# Patient Record
Sex: Female | Born: 1985 | Race: White | Hispanic: No | Marital: Married | Smoking: Current every day smoker
Health system: Southern US, Community
[De-identification: ages and names within clinical notes are randomized; demographics above are authoritative.]

## PROBLEM LIST (undated history)

## (undated) DIAGNOSIS — G43909 Migraine, unspecified, not intractable, without status migrainosus: Secondary | ICD-10-CM

---

## 2014-04-22 ENCOUNTER — Emergency Department (HOSPITAL_BASED_OUTPATIENT_CLINIC_OR_DEPARTMENT_OTHER): Payer: Self-pay

## 2014-04-22 ENCOUNTER — Emergency Department (HOSPITAL_BASED_OUTPATIENT_CLINIC_OR_DEPARTMENT_OTHER)
Admission: EM | Admit: 2014-04-22 | Discharge: 2014-04-22 | Disposition: A | Discharge status: Home / Self Care | Payer: Self-pay | Attending: Emergency Medicine | Admitting: Emergency Medicine

## 2014-04-22 ENCOUNTER — Encounter (HOSPITAL_BASED_OUTPATIENT_CLINIC_OR_DEPARTMENT_OTHER): Payer: Self-pay | Admitting: Emergency Medicine

## 2014-04-22 DIAGNOSIS — Y9289 Other specified places as the place of occurrence of the external cause: Secondary | ICD-10-CM | POA: Insufficient documentation

## 2014-04-22 DIAGNOSIS — W108XXA Fall (on) (from) other stairs and steps, initial encounter: Secondary | ICD-10-CM | POA: Insufficient documentation

## 2014-04-22 DIAGNOSIS — S93401A Sprain of unspecified ligament of right ankle, initial encounter: Secondary | ICD-10-CM | POA: Insufficient documentation

## 2014-04-22 DIAGNOSIS — Y9389 Activity, other specified: Secondary | ICD-10-CM | POA: Insufficient documentation

## 2014-04-22 DIAGNOSIS — Y998 Other external cause status: Secondary | ICD-10-CM | POA: Insufficient documentation

## 2014-04-22 DIAGNOSIS — R52 Pain, unspecified: Secondary | ICD-10-CM

## 2014-04-22 MED ORDER — TRAMADOL HCL 50 MG PO TABS: 50.0000 mg | ORAL_TABLET | Freq: Four times a day (QID) | ORAL | Status: DC | PRN

## 2014-04-22 NOTE — ED Provider Notes (Signed)
CSN: [id number]     Arrival date & time 04/22/14  1240 History   First MD Initiated Contact with Patient 04/22/14 1328     Chief Complaint  Patient presents with  . Foot Pain     (Consider location/radiation/quality/duration/timing/severity/associated sxs/prior Treatment) HPI   Stephanie Le is a 29 y.o. female complaining of pain to right lateral foot after slip and fall last night. Patient was standing on a step stool, she fell, she does not think there was head trauma, there is definitely not any LOC, cervicalgia, chest pain, shortness of breath. She rates her pain at 10 out of 10 when ambulating, is minimal when she is not weightbearing. No pain medication taken prior to arrival. No history of prior surgeries or traumas to the affected area.  History reviewed. No pertinent past medical history. History reviewed. No pertinent past surgical history. No family history on file. History  Substance Use Topics  . Smoking status: Not on file  . Smokeless tobacco: Not on file  . Alcohol Use: Not on file   OB History    No data available     Review of Systems  10 systems reviewed and found to be negative, except as noted in the HPI.  Allergies  Review of patient's allergies indicates no known allergies.  Home Medications   Prior to Admission medications   Medication Sig Start Date End Date Taking? Authorizing Provider  traMADol (ULTRAM) 50 MG tablet Take 1 tablet (50 mg total) by mouth every 6 (six) hours as needed. 04/22/14   Jerlene Rockers, PA-C   BP 119/59 mmHg  Pulse 76  Temp(Src) 98.3 F (36.8 C) (Oral)  Resp 18  Ht  (1.575 m)  Wt 190 lb (86.183 kg)  BMI 34.74 kg/m2  SpO2 98%  LMP 03/13/2014 Physical Exam  Constitutional: She is oriented to person, place, and time. She appears well-developed and well-nourished. No distress.  HENT:  Head: Normocephalic.  Eyes: Conjunctivae and EOM are normal.  Cardiovascular: Normal rate.   Pulmonary/Chest: Effort normal.  No stridor.  Musculoskeletal: Normal range of motion.       Feet:  Patient has swelling and pain as depicted on the diagram, there is no malleoli or tenderness to palpation, she has excellent range of motion in the toes and ankle. There is no tenderness palpation over the fifth digit.  Neurological: She is alert and oriented to person, place, and time.  Psychiatric: She has a normal mood and affect.  Nursing note and vitals reviewed.   ED Course  Procedures (including critical care time) Labs Review Labs Reviewed - No data to display  Imaging Review Dg Foot Complete Right  04/22/2014   CLINICAL DATA:  Pain following fall 1 day previously. Pain and swelling predominantly lateral.  EXAM: RIGHT FOOT COMPLETE - 3+ VIEW  COMPARISON:  None.  FINDINGS: Frontal, oblique, and lateral views were obtained. There is an apparent nondisplaced fracture along the junction of ankylosed fifth middle and distal phalanges. The ankylosis is an anatomic variant. The lucency in this area is oblique in orientation suggesting fracture as opposed to a rudimentary joint space. No other demonstrable fracture. No dislocation. Joint spaces appear intact.  IMPRESSION: Obliquely oriented fracture at the junction of the ankylosed middle and distal phalanges of the fifth digit. No other fracture. No dislocation. Joint spaces appear intact.   Electronically Signed   By: Bretta Bang M.D.   On: 04/22/2014 13:42     EKG Interpretation None  MDM   Final diagnoses:  Right ankle sprain, initial encounter    Filed Vitals:   04/22/14 1301 04/22/14 1504  BP: 147/76 119/59  Pulse: 79 76  Temp: 98.3 F (36.8 C)   TempSrc: Oral   Resp: 18 18  Height: 5\' 2"  (1.575 m)   Weight: 190 lb (86.183 kg)   SpO2: 100% 98%    Stephanie Le is a pleasant 29 y.o. female presenting with right lateral foot swelling and pain after patient had a slip and fall last night. X-ray shows a fracture of the right fifth distal  phalanx. The patient has no tenderness to palpation, swelling or signs of trauma in that area. She reports that she has broken toes in the past, this is likely a remote fracture. I've asked the patient if she would like it buddy taped and she declines. Patient will be given a Ace wrap, recommend rest, ice, compression elevation. Patient declines any pain medication in the ED. She has crutches which she's been using since last night. They are appropriately sized. She will be given sports medicine follow-up as needed if symptoms persist past 3-4 weeks.  Evaluation does not show pathology that would require ongoing emergent intervention or inpatient treatment. Pt is hemodynamically stable and mentating appropriately. Discussed findings and plan with patient/guardian, who agrees with care plan. All questions answered. Return precautions discussed and outpatient follow up given.   New Prescriptions   TRAMADOL (ULTRAM) 50 MG TABLET    Take 1 tablet (50 mg total) by mouth every 6 (six) hours as needed.        Wynetta Emeryicole Jalaysia Lobb, PA-C 04/22/14 1513  Rolan BuccoMelanie Belfi, MD 04/22/14 1555

## 2014-04-22 NOTE — ED Notes (Signed)
PT presents to ED with complaints of right foot pain after falling off stool last night landing on her right foot.

## 2014-04-22 NOTE — Discharge Instructions (Signed)
Rest, Ice intermittently (in the first 24-48 hours), Gentle compression with an Ace wrap, and elevate (Limb above the level of the heart)   Take up to 800mg  of ibuprofen (that is usually 4 over the counter pills)  3 times a day for 5 days. Take with food.   For breakthrough pain you may take Tramadol. Do not drink alcohol drive or operate heavy machinery when taking Tramadol.  Do not hesitate to return to the emergency room for any new, worsening or concerning symptoms.  Please obtain primary care using resource guide below. But the minute you were seen in the emergency room and that they will need to obtain records for further outpatient management.    Emergency Department Resource Guide 1) Find a Doctor and Pay Out of Pocket Although you won't have to find out who is covered by your insurance plan, it is a good idea to ask around and get recommendations. You will then need to call the office and see if the doctor you have chosen will accept you as a new patient and what types of options they offer for patients who are self-pay. Some doctors offer discounts or will set up payment plans for their patients who do not have insurance, but you will need to ask so you aren't surprised when you get to your appointment.  2) Contact Your Local Health Department Not all health departments have doctors that can see patients for sick visits, but many do, so it is worth a call to see if yours does. If you don't know where your local health department is, you can check in your phone book. The CDC also has a tool to help you locate your state's health department, and many state websites also have listings of all of their local health departments.  3) Find a Walk-in Clinic If your illness is not likely to be very severe or complicated, you may want to try a walk in clinic. These are popping up all over the country in pharmacies, drugstores, and shopping centers. They're usually staffed by nurse practitioners or  physician assistants that have been trained to treat common illnesses and complaints. They're usually fairly quick and inexpensive. However, if you have serious medical issues or chronic medical problems, these are probably not your best option.  No Primary Care Doctor: - Call Health Connect at  [phone] - they can help you locate a primary care doctor that  accepts your insurance, provides certain services, etc. - Physician Referral Service- [phone]  Chronic Pain Problems: Organization         Address  Phone   Notes  [facility]  [phone]) [phone] Patients need to be referred by their primary care doctor.   Medication Assistance: Organization         Address  Phone   Notes  [facility] Medication [facility]istance Program [address] [place], [place] [address] [phone]) [phone] --Must be a resident of [facility] -- Must have NO insurance coverage whatsoever (no Medicaid/ Medicare, etc.) -- The pt. MUST have a primary care doctor that directs their care regularly and follows them in the community   MedAssist  [phone]   [employer]  [phone]    Agencies that provide inexpensive medical care: Organization         Address  Phone   Notes  [facility]  [phone]) [phone]   [facility]    [phone]   Women's  [facility] [place], [place] [phone]   [facility] [place] [address] [place] [phone]   Planned Parenthood    [phone]   [facility]    [phone]   [address], [place] Phone:  [phone], Fax:  [phone] Hours of Operation:  9 am - 6 pm, M-F.  Also accepts Medicaid/Medicare and self-pay.  [facility] for [place], [address], [place] Phone: [phone], Fax: [phone]. Hours of Operation:  8:30 am - 5:30 pm, M-F.   Also accepts Medicaid and self-pay.  [facility] [place] [address] [place] Phone: [phone]   [place], [place], [place] [phone], Ext. [phone] Mondays & Thursdays: 7-9 AM.  First 15 patients are seen on a first come, first serve basis.    [place] Providers:  Organization         Address  Phone   Notes  [facility] [address], [place] [phone] Also accepts self-pay patients.  [facility] [address], [place]  [phone]   [place], [address], [place] [phone]   [facility] [address] [place] [phone]   Lucianne Lei [address], [place]   [phone] Only accepts [place] Access [place] patients after they have their name applied to their card.   Self-Pay (no insurance) in [facility]:  Organization         Address  Phone   Notes  Sickle Cell Patients, [facility] [place] [phone]   [facility] [place] [phone]   [facility] [place]  [place], [place], [place] [phone]   [facility]/Dr. Osei-Bonsu  [address] [place] or [address], [place] [phone] Phone number for both [place] and [place] locations is the same.  Urgent Medical and [facility] [address], [place] [phone]   [facility] [address] [place] or [address] [phone]   [facility] [address], [place] [phone], phone; [phone], fax Sees patients 1st and 3rd Saturday of every month.  Must not qualify for public or private insurance (i.e. Medicaid, Medicare, [place] Health Choice, Veterans'  Benefits)  Household income should be no more than 200% of the poverty level The clinic cannot treat you if you are pregnant or think you are pregnant  Sexually transmitted diseases are not treated at the clinic.    Dental Care: Organization         Address  Phone  Notes  [facility] of [facility] [place] [phone] Accepts children up to age 68 who are enrolled in [place] or [place]; pregnant women with a Medicaid card; and children who have applied for Medicaid or [place] Health Choice, but were declined, whose parents can pay a reduced fee at time of service.  [facility] Department of [facility]  [address], [place] [phone] Accepts children up to age 32 who are  enrolled in Medicaid or [place] Health Choice; pregnant women with a Medicaid card; and children who have applied for Medicaid or [place] Health Choice, but were declined, whose parents can pay a reduced fee at time of service.  [place] Adult Dental Access PROGRAM  [place] [phone] Patients are seen by appointment only. Walk-ins are not accepted. [place] will see patients 76 years of age and older. Monday - Tuesday (8am-5pm) Most Wednesdays (8:30-5pm) $30 per visit, cash only  [facility] Adult Dental Access PROGRAM  [address], [facility] [phone] Patients are seen by appointment only. Walk-ins are not accepted. [place] will see patients 70 years of age and older. One Wednesday Evening (Monthly: Volunteer Based).  $30 per visit, cash only  [place]  [phone] for adults; Children under age 52, call Graduate Pediatric Dentistry at [phone]. Children aged 69-14, please call [phone] to request a pediatric application.  Dental services are provided in all areas of dental care including fillings, crowns and bridges, complete and partial  dentures, implants, gum treatment, root canals, and extractions. Preventive care is also provided. Treatment is provided to both adults and children. Patients are selected via a lottery and there is often a waiting list.   [facility] [address] [place]  [phone] [email or web address]   [facility] [address] [place], [place] [phone], Ext. [phone] Second and Fourth Thursday of each month, opens at 6:30 AM; Clinic ends at 9 AM.  Patients are seen on a first-come first-served basis, and a limited number are seen during each clinic.   [facility]  [address] Hillard Danker [place], [place] [phone]   Eligibility Requirements You must have lived in [place], [place], or [place] counties for at least the last three months.   You cannot be eligible for state or federal sponsored [employer], including [employer], [place], or [employer].   You generally cannot be eligible for healthcare insurance through your employer.    How to apply: Eligibility screenings are held every Tuesday and Wednesday afternoon from 1:00 pm until 4:00 pm. You do not need an appointment for the interview!  [facility] [address], [place], [place]   [place]  [place] Department  [place]  [phone]    Behavioral Health Resources in the Community: Intensive Outpatient Programs Organization         Address  Phone  Notes  [place]. [address], [place], [place] [phone]   [facility] [address] [place], [place]   ADS: Alcohol & Drug Svcs [address] [place], [place]   [place] [address],  [place], [place] or [phone]   Substance Abuse Resources Organization          Address  Phone  Notes  Alcohol and Drug Services  [phone]   [place]  [phone]   [facility] [place]   Chinita [place]  [phone]   Residential & Outpatient Substance Abuse Program  [phone]   Psychological Services Organization         Address  Phone  Notes  [facility] [place]  [place]  [phone]   [place] [address] [place]  [phone] or [phone]    Mobile Crisis Teams Organization         Address  Phone  Notes  [facility], Mobile Crisis Care Unit  [phone]   [facility]  [address] [place], [place] [phone]   [facility] De[facility]sch [address] [place] [phone]    Self-Help/Support Groups Organization         Address  Phone             Notes  [facility] [place] - variety of support groups  336- [phone] Call for more information  Narcotics Anonymous (NA), [facility] [address], [place]  2 meetings at this location   Statisticianesidential Treatment Programs Organization         Address  Phone  Notes  [facility] [address],    [place]  [phone]   [facility]  [address], [place] [phone], [place], [place] [phone]   [facility] [address], [place] [phone] Admissions: 8am-3pm M-F  [facility] [address]    [place], [place] [phone]   [facility] [address], [place], [place] [phone]   [facility] [address]  [place], [place] [phone]   [facility] - Intensive Outpatient [address], [place], [place] [phone]   [facility] ([facility] [address]  [place], [place] [phone] or [phone]   [facility] (RTS) [address] [place], [place]  [phone] Accepts Medicaid  [facility] [address]  [place] [phone] Substance Abuse/Addiction Treatment   [facility] Behavioral Health Resources Organization         Address  Phone  Notes  [facility]  [phone]   Angie FavaJulie Brannon, PhD [address], [address] [place], [place]   [phone] or [phone]) [phone]   [facility]   [address] [place], [place] [phone](336) [phone]   [facility] [address], [place], [place] [phone](336) [phone] Insurance/Medicaid/sponsorship through [facility]  [facility] Families [address]                                    [place], [place] [phone]) [phone] Therapy/tele-psych/case  [facility] [address].   [place], [place] [phone]) [phone]    Dr. Lolly MustacheArfeen  [phone](336) [phone]   [facility]  [employer] [facility] 1) [address], [place] 2) [address], [place] 3)  [address], [place] [phone](336) [phone]  [phone]) [phone]   [facility] Child Abuse Hotline [phone](336) [phone] or [phone](336) [phone] (After Hours)

## 2014-06-05 ENCOUNTER — Emergency Department (HOSPITAL_BASED_OUTPATIENT_CLINIC_OR_DEPARTMENT_OTHER)
Admission: EM | Admit: 2014-06-05 | Discharge: 2014-06-05 | Disposition: A | Discharge status: Home / Self Care | Payer: Self-pay | Attending: Emergency Medicine | Admitting: Emergency Medicine

## 2014-06-05 ENCOUNTER — Encounter (HOSPITAL_BASED_OUTPATIENT_CLINIC_OR_DEPARTMENT_OTHER): Payer: Self-pay | Admitting: Emergency Medicine

## 2014-06-05 DIAGNOSIS — L299 Pruritus, unspecified: Secondary | ICD-10-CM | POA: Insufficient documentation

## 2014-06-05 HISTORY — DX: Migraine, unspecified, not intractable, without status migrainosus: G43.909

## 2014-06-05 MED ORDER — LORAZEPAM 1 MG PO TABS
2.0000 mg | ORAL_TABLET | Freq: Once | ORAL | Status: AC
  Administered 2014-06-05: 2 mg via ORAL
  Filled 2014-06-05: qty 2

## 2014-06-05 MED ORDER — LORAZEPAM 1 MG PO TABS: 1.0000 mg | ORAL_TABLET | Freq: Four times a day (QID) | ORAL | Status: DC | PRN

## 2014-06-05 MED ORDER — PREDNISONE 20 MG PO TABS: 60.0000 mg | ORAL_TABLET | Freq: Every day | ORAL | Status: AC

## 2014-06-05 MED ORDER — METHYLPREDNISOLONE SODIUM SUCC 125 MG IJ SOLR
125.0000 mg | Freq: Once | INTRAMUSCULAR | Status: AC
  Administered 2014-06-05: 125 mg via INTRAMUSCULAR
  Filled 2014-06-05: qty 2

## 2014-06-05 NOTE — Discharge Instructions (Signed)
Pruritus  Pruritus is an itch. There are many different problems that can cause an itch. Dry skin is one of the most common causes of itching. Most cases of itching do not require medical attention.  HOME CARE INSTRUCTIONS  Make sure your skin is moistened on a regular basis. A moisturizer that contains petroleum jelly is best for keeping moisture in your skin. If you develop a rash, you may try the following for relief:   Use corticosteroid cream.  Apply cool compresses to the affected areas.  Bathe with Epsom salts or baking soda in the bathwater.  Soak in colloidal oatmeal baths (like Aveeno). These are available at your pharmacy.  Apply baking soda paste to the rash. Stir water into baking soda until it reaches a paste-like consistency.  Use an anti-itch lotion.  Take over-the-counter diphenhydramine medicine by mouth as the instructions direct.  You may take Benadryl 50 mg every 6 hours as needed.  Avoid scratching. Scratching may cause the rash to become infected. If itching is very bad, your caregiver may suggest prescription lotions or creams to lessen your symptoms.  Avoid hot showers, which can make itching worse. A cold shower may help with itching as long as you use a moisturizer after the shower. SEEK MEDICAL CARE IF: The itching does not go away after several days. Document Released: 12/11/2010 Document Revised: 08/15/2013 Document Reviewed: 12/11/2010 [facility] Patient Information 2015 [place], [place]. This information is not intended to replace advice given to you by your health care provider. Make sure you discuss any questions you have with your health care provider.    Possible Early Allergic Reaction/Anaphylactic Reaction An anaphylactic reaction is a sudden, severe allergic reaction that involves the whole body. It can be life threatening. A hospital stay is often required. People with asthma, eczema, or hay fever are slightly more likely to have an anaphylactic  reaction. CAUSES  An anaphylactic reaction may be caused by anything to which you are allergic. After being exposed to the allergic substance, your immune system becomes sensitized to it. When you are exposed to that allergic substance again, an allergic reaction can occur. Common causes of an anaphylactic reaction include:  Medicines.  Foods, especially peanuts, wheat, shellfish, milk, and eggs.  Insect bites or stings.  Blood products.  Chemicals, such as dyes, latex, and contrast material used for imaging tests. SYMPTOMS  When an allergic reaction occurs, the body releases histamine and other substances. These substances cause symptoms such as tightening of the airway. Symptoms often develop within seconds or minutes of exposure. Symptoms may include:  Skin rash or hives.  Itching.  Chest tightness.  Swelling of the eyes, tongue, or lips.  Trouble breathing or swallowing.  Lightheadedness or fainting.  Anxiety or confusion.  Stomach pains, vomiting, or diarrhea.  Nasal congestion.  A fast or irregular heartbeat (palpitations). DIAGNOSIS  Diagnosis is based on your history of recent exposure to allergic substances, your symptoms, and a physical exam. Your caregiver may also perform blood or urine tests to confirm the diagnosis. TREATMENT  Epinephrine medicine is the main treatment for an anaphylactic reaction. Other medicines that may be used for treatment include antihistamines, steroids, and albuterol. In severe cases, fluids and medicine to support blood pressure may be given through an intravenous line (IV). Even if you improve after treatment, you need to be observed to make sure your condition does not get worse. This may require a stay in the hospital. [place] a medical  alert bracelet or necklace stating your allergy.  You and your family must learn how to use an anaphylaxis kit or give an epinephrine injection to temporarily treat an  emergency allergic reaction. Always carry your epinephrine injection or anaphylaxis kit with you. This can be lifesaving if you have a severe reaction.  Do not drive or perform tasks after treatment until the medicines used to treat your reaction have worn off, or until your caregiver says it is okay.  If you have hives or a rash:  Take medicines as directed by your caregiver.  You may use an over-the-counter antihistamine (diphenhydramine) as needed.  Apply cold compresses to the skin or take baths in cool water. Avoid hot baths or showers. SEEK MEDICAL CARE IF:   You develop symptoms of an allergic reaction to a new substance. Symptoms may start right away or minutes later.  You develop a rash, hives, or itching.  You develop new symptoms. SEEK IMMEDIATE MEDICAL CARE IF:   You have swelling of the mouth, difficulty breathing, or wheezing.  You have a tight feeling in your chest or throat.  You develop hives, swelling, or itching all over your body.  You develop severe vomiting or diarrhea.  You feel faint or pass out. This is an emergency. Use your epinephrine injection or anaphylaxis kit as you have been instructed. Call your local emergency services (911 in [place] Even if you improve after the injection, you need to be examined at a hospital emergency department. MAKE SURE YOU:   Understand these instructions.  Will watch your condition.  Will get help right away if you are not doing well or get worse. Document Released: 03/31/2005 Document Revised: 04/05/2013 Document Reviewed: 07/02/2011 [facility] Patient Information 2015 [place], [place]. This information is not intended to replace advice given to you by your health care provider. Make sure you discuss any questions you have with your health care provider.

## 2014-06-05 NOTE — ED Notes (Signed)
Pt reports onset of itching and discomfort approx. 7 pm yesterday no hives or SOB noted but continues to have itching. Denies throat itching or difficulty swallowing

## 2014-06-05 NOTE — ED Provider Notes (Signed)
TIME SEEN: 6:00 AM  CHIEF COMPLAINT: Possible allergic reaction  HPI: Pt is a 29 y.o. female with no significant past medical history who reports to the emergency department with complaints of itching all over that started at 7 PM last night. Denies any hives or rash. No shortness of breath or wheezing. No angioedema. States she took Lipozene yesterday and that is the only new exposure that she can recall. States she had similar symptoms once in the past with an allergic reaction. States she had itching all over without rash or hives and was given IM steroids and oral Benadryl. States that she had to return the emergency room several hours later for diffuse hives. Has an epinephrine pen at home but has not been using it. Denies fevers, cough, abdominal pain, vomiting or diarrhea.  ROS: See HPI Constitutional: no fever  Eyes: no drainage  ENT: no runny nose   Cardiovascular:  no chest pain  Resp: no SOB  GI: no vomiting GU: no dysuria Integumentary: no rash  Allergy: no hives  Musculoskeletal: no leg swelling  Neurological: no slurred speech ROS otherwise negative  PAST MEDICAL HISTORY/PAST SURGICAL HISTORY:  No past medical history on file.  MEDICATIONS:  Prior to Admission medications   Medication Sig Start Date End Date Taking? Authorizing Provider  traMADol (ULTRAM) 50 MG tablet Take 1 tablet (50 mg total) by mouth every 6 (six) hours as needed. 04/22/14   Joni Reining Pisciotta, PA-C    ALLERGIES:  No Known Allergies  SOCIAL HISTORY:  History  Substance Use Topics  . Smoking status: Not on file  . Smokeless tobacco: Not on file  . Alcohol Use: Not on file    FAMILY HISTORY: No family history on file.  EXAM: BP 132/69 mmHg  Temp(Src) 98 F (36.7 C) (Oral)  Resp 20  Ht  (1.575 m)  Wt 190 lb (86.183 kg)  BMI 34.74 kg/m2  SpO2 100% CONSTITUTIONAL: Alert and oriented and responds appropriately to questions. Well-appearing; well-nourished HEAD: Normocephalic EYES:  Conjunctivae clear, PERRL ENT: normal nose; no rhinorrhea; moist mucous membranes; pharynx without lesions noted, no angioedema, normal phonation, no stridor, no trismus or drooling NECK: Supple, no meningismus, no LAD  CARD: RRR; S1 and S2 appreciated; no murmurs, no clicks, no rubs, no gallops RESP: Normal chest excursion without splinting or tachypnea; breath sounds clear and equal bilaterally; no wheezes, no rhonchi, no rales, no hypoxia or respiratory distress, good aeration diffusely ABD/GI: Normal bowel sounds; non-distended; soft, non-tender, no rebound, no guarding BACK:  The back appears normal and is non-tender to palpation, there is no CVA tenderness EXT: Normal ROM in all joints; non-tender to palpation; no edema; normal capillary refill; no cyanosis    SKIN: Normal color for age and race; warm, no hives, no rash NEURO: Moves all extremities equally PSYCH: The patient's mood and manner are appropriate. Grooming and personal hygiene are appropriate.  MEDICAL DECISION MAKING: Pt here with possible allergic reaction. She is currently scratching all over but has no obvious rash or hives. No other sign of allergic reaction. Has had similar symptoms in the past in the setting of an allergic reaction. States that when this happened before IV Benadryl and IV Solu-Medrol helped her the most. Have offered her an IV today but states that she would like to hold off because of lack of insurance. She took 50 mg of Benadryl at 5 AM. Will give dose of Ativan to help with patient itching and anxiety. Will give dose of  IM site Medrol. Discussed with patient that I recommend continuing the Benadryl 50 mg every 6 hours and will also discharge with prescription for Ativan and prednisone burst. Discussed return precautions and when to use her epinephrine pen. She verbalized understanding and is comfortable with plan. Given patient's symptoms started 12 hours ago and have not significantly progressed and she has  no signs of anaphylaxis I do not feel she needs to be monitored further in the ED.       Layla MawKristen N Dayami Taitt, DO 06/05/14 [phone]

## 2014-06-07 ENCOUNTER — Emergency Department (HOSPITAL_BASED_OUTPATIENT_CLINIC_OR_DEPARTMENT_OTHER)
Admission: EM | Admit: 2014-06-07 | Discharge: 2014-06-07 | Disposition: A | Discharge status: Home / Self Care | Payer: Self-pay | Attending: Emergency Medicine | Admitting: Emergency Medicine

## 2014-06-07 ENCOUNTER — Encounter (HOSPITAL_BASED_OUTPATIENT_CLINIC_OR_DEPARTMENT_OTHER): Payer: Self-pay

## 2014-06-07 DIAGNOSIS — Z720 Tobacco use: Secondary | ICD-10-CM | POA: Insufficient documentation

## 2014-06-07 DIAGNOSIS — Z8679 Personal history of other diseases of the circulatory system: Secondary | ICD-10-CM | POA: Insufficient documentation

## 2014-06-07 DIAGNOSIS — Z79899 Other long term (current) drug therapy: Secondary | ICD-10-CM | POA: Insufficient documentation

## 2014-06-07 DIAGNOSIS — R0602 Shortness of breath: Secondary | ICD-10-CM | POA: Insufficient documentation

## 2014-06-07 DIAGNOSIS — Z7952 Long term (current) use of systemic steroids: Secondary | ICD-10-CM | POA: Insufficient documentation

## 2014-06-07 DIAGNOSIS — F419 Anxiety disorder, unspecified: Secondary | ICD-10-CM | POA: Insufficient documentation

## 2014-06-07 DIAGNOSIS — R0789 Other chest pain: Secondary | ICD-10-CM | POA: Insufficient documentation

## 2014-06-07 DIAGNOSIS — T7840XD Allergy, unspecified, subsequent encounter: Secondary | ICD-10-CM | POA: Insufficient documentation

## 2014-06-07 MED ORDER — DIPHENHYDRAMINE HCL 50 MG/ML IJ SOLN
25.0000 mg | Freq: Once | INTRAMUSCULAR | Status: AC
  Administered 2014-06-07: 25 mg via INTRAMUSCULAR
  Filled 2014-06-07: qty 1

## 2014-06-07 MED ORDER — LORAZEPAM 2 MG/ML IJ SOLN
1.0000 mg | Freq: Once | INTRAMUSCULAR | Status: AC
  Administered 2014-06-07: 1 mg via INTRAMUSCULAR
  Filled 2014-06-07: qty 1

## 2014-06-07 MED ORDER — DIPHENHYDRAMINE HCL 25 MG PO TABS: 25.0000 mg | ORAL_TABLET | Freq: Four times a day (QID) | ORAL | Status: AC

## 2014-06-07 MED ORDER — FAMOTIDINE 20 MG PO TABS: 20.0000 mg | ORAL_TABLET | Freq: Two times a day (BID) | ORAL | Status: AC

## 2014-06-07 MED ORDER — LORAZEPAM 1 MG PO TABS
1.0000 mg | ORAL_TABLET | Freq: Once | ORAL | Status: AC
  Administered 2014-06-07: 1 mg via ORAL
  Filled 2014-06-07: qty 1

## 2014-06-07 NOTE — ED Notes (Signed)
MD at bedside. 

## 2014-06-07 NOTE — Discharge Instructions (Signed)
Allergies Take the antihistamines and steroids as prescribed. Establish care with a primary doctor. Return to the ED if you develop chest pain, shortness of breath, trouble swallowing or any other concerns. Allergies may happen from anything your body is sensitive to. This may be food, medicines, pollens, chemicals, and nearly anything around you in everyday life that produces allergens. An allergen is anything that causes an allergy producing substance. Heredity is often a factor in causing these problems. This means you may have some of the same allergies as your parents. Food allergies happen in all age groups. Food allergies are some of the most severe and life threatening. Some common food allergies are cow's milk, seafood, eggs, nuts, wheat, and soybeans. SYMPTOMS   Swelling around the mouth.  An itchy red rash or hives.  Vomiting or diarrhea.  Difficulty breathing. SEVERE ALLERGIC REACTIONS ARE LIFE-THREATENING. This reaction is called anaphylaxis. It can cause the mouth and throat to swell and cause difficulty with breathing and swallowing. In severe reactions only a trace amount of food (for example, peanut oil in a salad) may cause death within seconds. Seasonal allergies occur in all age groups. These are seasonal because they usually occur during the same season every year. They may be a reaction to molds, grass pollens, or tree pollens. Other causes of problems are house dust mite allergens, pet dander, and mold spores. The symptoms often consist of nasal congestion, a runny itchy nose associated with sneezing, and tearing itchy eyes. There is often an associated itching of the mouth and ears. The problems happen when you come in contact with pollens and other allergens. Allergens are the particles in the air that the body reacts to with an allergic reaction. This causes you to release allergic antibodies. Through a chain of events, these eventually cause you to release histamine into the  blood stream. Although it is meant to be protective to the body, it is this release that causes your discomfort. This is why you were given anti-histamines to feel better. If you are unable to pinpoint the offending allergen, it may be determined by skin or blood testing. Allergies cannot be cured but can be controlled with medicine. Hay fever is a collection of all or some of the seasonal allergy problems. It may often be treated with simple over-the-counter medicine such as diphenhydramine. Take medicine as directed. Do not drink alcohol or drive while taking this medicine. Check with your caregiver or package insert for child dosages. If these medicines are not effective, there are many new medicines your caregiver can prescribe. Stronger medicine such as nasal spray, eye drops, and corticosteroids may be used if the first things you try do not work well. Other treatments such as immunotherapy or desensitizing injections can be used if all else fails. Follow up with your caregiver if problems continue. These seasonal allergies are usually not life threatening. They are generally more of a nuisance that can often be handled using medicine. HOME CARE INSTRUCTIONS   If unsure what causes a reaction, keep a diary of foods eaten and symptoms that follow. Avoid foods that cause reactions.  If hives or rash are present:  Take medicine as directed.  You may use an over-the-counter antihistamine (diphenhydramine) for hives and itching as needed.  Apply cold compresses (cloths) to the skin or take baths in cool water. Avoid hot baths or showers. Heat will make a rash and itching worse.  If you are severely allergic:  Following a treatment for a severe  reaction, hospitalization is often required for closer follow-up.  Wear a medic-alert bracelet or necklace stating the allergy.  You and your family must learn how to give adrenaline or use an anaphylaxis kit.  If you have had a severe reaction, always  carry your anaphylaxis kit or EpiPen with you. Use this medicine as directed by your caregiver if a severe reaction is occurring. Failure to do so could have a fatal outcome. SEEK MEDICAL CARE IF:  You suspect a food allergy. Symptoms generally happen within 30 minutes of eating a food.  Your symptoms have not gone away within 2 days or are getting worse.  You develop new symptoms.  You want to retest yourself or your child with a food or drink you think causes an allergic reaction. Never do this if an anaphylactic reaction to that food or drink has happened before. Only do this under the care of a caregiver. SEEK IMMEDIATE MEDICAL CARE IF:   You have difficulty breathing, are wheezing, or have a tight feeling in your chest or throat.  You have a swollen mouth, or you have hives, swelling, or itching all over your body.  You have had a severe reaction that has responded to your anaphylaxis kit or an EpiPen. These reactions may return when the medicine has worn off. These reactions should be considered life threatening. MAKE SURE YOU:   Understand these instructions.  Will watch your condition.  Will get help right away if you are not doing well or get worse. Document Released: 06/24/2002 Document Revised: 07/26/2012 Document Reviewed: 11/29/2007 [facility] Patient Information 2015 [place], [place]. This information is not intended to replace advice given to you by your health care provider. Make sure you discuss any questions you have with your health care provider.   Emergency Department Resource Guide 1) Find a Doctor and Pay Out of [place] Although you won't have to find out who is covered by your insurance plan, it is a good idea to ask around and get recommendations. You will then need to call the office and see if the doctor you have chosen will accept you as a new patient and what types of options they offer for patients who are self-pay. Some doctors offer discounts or will set up  payment plans for their patients who do not have insurance, but you will need to ask so you aren't surprised when you get to your appointment.  2) Contact Your Local Health Department Not all health departments have doctors that can see patients for sick visits, but many do, so it is worth a call to see if yours does. If you don't know where your local health department is, you can check in your phone book. The CDC also has a tool to help you locate your state's health department, and many state websites also have listings of all of their local health departments.  3) Find a [facility] If your illness is not likely to be very severe or complicated, you may want to try a walk in clinic. These are popping up all over the country in pharmacies, drugstores, and shopping centers. They're usually staffed by nurse practitioners or physician assistants that have been trained to treat common illnesses and complaints. They're usually fairly quick and inexpensive. However, if you have serious medical issues or chronic medical problems, these are probably not your best option.  No Primary Care Doctor: - Call Health Connect at  [phone] - they can help you locate a primary care doctor that  accepts your insurance, provides certain services, etc. - Physician Referral Service- [phone]  Chronic Pain Problems: Organization         Address  Phone   Notes  [facility]  [phone] Patients need to be referred by their primary care doctor.   Medication Assistance: Organization         Address  Phone   Notes  [facility] Medication [facility] [address] [place], [place] [address] [phone] --Must be a resident of [facility] -- Must have NO insurance coverage whatsoever (no Medicaid/ Medicare, etc.) -- The pt. MUST have a primary care doctor that directs their care regularly and follows them in the community   MedAssist  [phone]   [employer]  [phone]    Agencies that provide inexpensive medical care: Organization         Address  Phone   Notes  [place]  [phone]   [facility]    [phone]   [facility] [place], [place] [address] [phone]   [place] [address], [place] [phone]   Planned Parenthood    [phone]   [facility]    [phone]   [place] [address], [place] Phone:  [phone], Fax:  [phone] Hours of Operation:  9 am - 6 pm, M-F.  Also accepts Medicaid/Medicare and self-pay.  [facility] for [place], [address], [place] Phone: [phone], Fax: [phone]. Hours of Operation:  8:30 am - 5:30 pm, M-F.  Also accepts Medicaid and self-pay.  [facility] [address] [place] Phone: [phone]   [place], [place], [place] [phone], Ext. [phone] Mondays & Thursdays: 7-9 AM.  First 15 patients are seen on a first come, first serve basis.    [place] Providers:  Organization         Address  Phone   Notes  [facility] [address], [place] [phone] Also accepts self-pay patients.  [facility] [address], [place]  [phone]   [place], [address], [place] [phone]   [facility] [address], [place] [phone]   Lucianne Lei [address], [place]   [phone] Only accepts [place] Access [place] patients after they have their name applied to their card.   Self-Pay (no insurance) in [facility]:  Organization         Address  Phone   Notes  Sickle Cell Patients, [facility] [place]  [phone]   [facility] Urgent Care [place] [phone]   [facility] [place]  [place], [place], [place] [phone]   [facility]/Dr. Osei-Bonsu  [address] [place] or [address], [place] [phone] Phone number for both [place] and [place] locations is the same.  Urgent Medical and [facility] [address] [place] [phone]   [facility] [address] [place] or [address] [phone]   [place]  [facility] [place] [phone], phone; [phone], fax Sees patients 1st and 3rd Saturday of every month.  Must not qualify for public or private insurance (i.e. Medicaid, Medicare, [place] Health Choice, Veterans' Benefits)  Household income should be no more than 200% of the poverty level The clinic cannot treat you if you are pregnant or think you are pregnant  Sexually transmitted diseases are not treated at the clinic.    Dental Care: Organization         Address  Phone  Notes  [facility] Department of [facility] [place] [phone] Accepts children up to age 64 who are enrolled in [place] or [place]; pregnant women with a Medicaid card; and children who have applied for Medicaid or [place] Health Choice, but were declined, whose parents can pay a reduced fee at time of service.  [facility] Department of [facility]  [address], [place] [phone] Accepts children up to age 75 who are enrolled in [place] or [place]; pregnant women with a Medicaid card; and children who have applied for Medicaid or [place] Health Choice, but were declined, whose parents can pay a reduced fee at time of service.  [place] Adult Dental Access PROGRAM  [place] [phone] Patients  are seen by appointment only. Walk-ins are not accepted. [place] will see patients 25 years of age and older. Monday - Tuesday (8am-5pm) Most Wednesdays (8:30-5pm) $30 per visit, cash only  [facility] Adult Dental Access PROGRAM  [address], [facility] [phone] Patients are seen by appointment only. Walk-ins are not accepted. [place] will see patients 32 years of age and older. One Wednesday Evening (Monthly: Volunteer Based).  $30 per visit, cash only  [place]  [phone] for adults; Children under age 28, call Graduate Pediatric Dentistry at [phone]. Children aged 71-14, please call [phone] to request a pediatric application.  Dental services are provided in all areas of dental care including fillings, crowns and bridges, complete and partial dentures, implants, gum treatment, root canals, and extractions. Preventive care is also provided. Treatment is provided to both adults and children. Patients are selected via a lottery and there is often a waiting list.   [facility] [address] [place]  [phone] [email or web address]   [facility] [address] [place], [place] [phone], Ext. [phone] Second and Fourth Thursday of each month, opens at 6:30 AM; Clinic ends at 9 AM.  Patients are seen on a first-come first-served basis, and a limited number are seen during each clinic.   [facility]  [address] Hillard Danker [place], [place] [phone]   Eligibility Requirements You must have lived in [place], [place], or [place] counties for at least the last three months.   You cannot be eligible for state or federal sponsored [employer], including [employer], [place], or [employer].   You generally cannot be eligible for healthcare insurance through your employer.    How to apply: Eligibility screenings are held every Tuesday and Wednesday afternoon from 1:00 pm until  4:00 pm. You do not need an appointment for the interview!  [facility] [address], [place], [place]   [place]  [place] Department  [place] Department  [place] in the Community: Intensive Outpatient Programs Organization         Address  Phone  Notes  [place]. [address], [place], [place] [phone]   [facility] Outpatient [address] [place], [place]   ADS: Alcohol & Drug Svcs [address] [place], [place]   [place] [address]  [place], [place] or [phone]   Substance Abuse Resources Organization         Address  Phone  Notes  Alcohol and Drug Services  [phone]   [place]  [phone]   [facility] [place]   Chinita Pester  [phone]   Residential & Outpatient Substance Abuse Program  [phone]   Psychological Services Organization         Address  Phone  Notes  [facility]  [place]  [phone]   [place] [address] [place] or [phone]    Mobile Crisis Teams Organization         Address  Phone  Notes  [facility], Mobile Crisis Care Unit  [phone]   [facility]  [address] [place], [place]   [place] [address], [place] [phone]    Self-Help/Support Groups Organization         Address  Phone             Notes  [place]. of [place] - variety of support groups  Ina Call for more information  Narcotics Anonymous (NA), [facility] [address], [employer]  2 meetings at this location   Special educational needs teacher          Address  Phone  Notes  ASAP Residential Treatment [place],    [place]  [phone]   [facility]  [address] [place] [phone], [place], [place]   [place], [place] [phone] Admissions: 8am-3pm M-F  Incentives Substance [place] [address]    [place], [place] [phone]   [facility] [address] [place], [place], [place]   [facility] [address],  [place], [place]   [facility] - Intensive Outpatient [address], [place], [place]   [facility] ([place].,  [place], [place] [phone] or [phone]   [facility]idential Treatment Services (RTS) [address] [place], [place] Accepts Medicaid  [facility] [place] [address],  [place] [phone] Substance Abuse/Addiction Treatment   [facility] Organization         Address  Phone  Notes  [facility]  [phone]   Domenic Schwab, PhD [address] Arlis [place], [place]   [phone] or [phone]   [facility] [place], [place] [phone]   [place] [address] [place], [place] [phone] Insurance/Medicaid/sponsorship through [employer] and Families [address] [place]                                    [place], [place] [phone] [place] [address]  [place], [place] [phone]    Dr. Adele Schilder  [phone]   [facility] Dept. 1) [address], [place] 2) [place] 3)  [place] [address], [place] [phone]  [phone]   [place] [phone] or [phone] (After Hours)

## 2014-06-07 NOTE — ED Provider Notes (Signed)
CSN: [id number]     Arrival date & time 06/07/14  1831 History  This chart was scribe for No att. providers found by Angelene Giovanni, ED Scribe. The patient was seen in room MHOTF/OTF and the patient's care was started at 8:04 PM.      Chief Complaint  Patient presents with  . Allergic Reaction   The history is provided by the patient. No language interpreter was used.   HPI Comments: Stephanie Le is a 29 y.o. female with a hx of anxiety who presents to the Emergency Department complaining of an allergic reaction. She was seen 2 days ago for the same symptoms and was given prednisone, benadryl, and Ativan. She reports that she could have been having a reaction to a dietary supplement. She is here today because she reports a gradually worsening chest tightness, tightness in hands and feet, itching, and SOB. She denies vomiting and diarrhea. She also denies that her symptoms have improved. She denies any new medication, lotions, soaps, or new food. She reports that there is no chance that she could be pregnant and her LKMP was 05/06/14.  Past Medical History  Diagnosis Date  . Migraine    History reviewed. No pertinent past surgical history. No family history on file. History  Substance Use Topics  . Smoking status: Current Every Day Smoker  . Smokeless tobacco: Never Used  . Alcohol Use: Yes   OB History    No data available     Review of Systems  Respiratory: Positive for chest tightness and shortness of breath.   Gastrointestinal: Negative for vomiting and diarrhea.  Skin:       Itchy skin.       Allergies  Review of patient's allergies indicates no known allergies.  Home Medications   Prior to Admission medications   Medication Sig Start Date End Date Taking? Authorizing Provider  diphenhydrAMINE (BENADRYL) 25 MG tablet Take 1 tablet (25 mg total) by mouth every 6 (six) hours. 06/07/14   Glynn Octave, MD  famotidine (PEPCID) 20 MG tablet Take 1 tablet (20 mg  total) by mouth 2 (two) times daily. 06/07/14   Glynn Octave, MD  predniSONE (DELTASONE) 20 MG tablet Take 3 tablets (60 mg total) by mouth daily. 06/05/14   Kristen N Ward, DO   BP 127/79 mmHg  Pulse 81  Temp(Src) 98.5 F (36.9 C) (Oral)  Resp 18  Ht  (1.575 m)  Wt 190 lb (86.183 kg)  BMI 34.74 kg/m2  SpO2 97%  LMP 05/08/2014 Physical Exam  Constitutional: She is oriented to person, place, and time. She appears well-developed and well-nourished. No distress.  HENT:  Head: Normocephalic and atraumatic.  Mouth/Throat: Oropharynx is clear and moist. No oropharyngeal exudate.  No lip swelling or angioedema  Eyes: Conjunctivae and EOM are normal. Pupils are equal, round, and reactive to light.  Neck: Normal range of motion. Neck supple.  No meningismus.  Cardiovascular: Normal rate, regular rhythm, normal heart sounds and intact distal pulses.   No murmur heard. Pulmonary/Chest: Effort normal and breath sounds normal. No respiratory distress. She has no wheezes.  Abdominal: Soft. There is no tenderness. There is no rebound and no guarding.  Musculoskeletal: Normal range of motion. She exhibits no edema or tenderness.  Neurological: She is alert and oriented to person, place, and time. No cranial nerve deficit. She exhibits normal muscle tone. Coordination normal.  No ataxia on finger to nose bilaterally. No pronator drift. 5/5 strength throughout. CN 2-12 intact. Negative  Romberg. Equal grip strength. Sensation intact. Gait is normal.   Skin: Skin is warm.  Splatty erythema to hands and feet.   Psychiatric: She has a normal mood and affect. Her behavior is normal.  Anxious  Nursing note and vitals reviewed.   ED Course  Procedures (including critical care time) DIAGNOSTIC STUDIES: Oxygen Saturation is 100% on RA, normal by my interpretation.    COORDINATION OF CARE: 8:10 PM- Pt advised of plan for treatment and pt agrees.    Labs Review Labs Reviewed - No data to  display  Imaging Review No results found.   EKG Interpretation   Date/Time:  Wednesday June 07 2014 20:36:40 EST Ventricular Rate:  77 PR Interval:  154 QRS Duration: 78 QT Interval:  354 QTC Calculation: 400 R Axis:   74 Text Interpretation:  Normal sinus rhythm Normal ECG No previous ECGs  available Confirmed by Manus GunningANCOUR  MD, Vanessia Bokhari [phone]([id number]) on 06/07/2014 8:38:01  PM      MDM   Final diagnoses:  Allergic reaction, subsequent encounter  Anxiety  Continued itching, burning to hands and feet and chest tightness since being treated for possible allergic reaction to dietary supplement.  Anxious appearing with acute respiratory distress, no wheezing, no oral swelling or angioedema.  Splotchy rash to hands and feet. Antihistamines and ativan given.  Already received steroids today.  Appears to have significant anxiety component.  States was on benzos prn in past and prescribed ativan 2 days ago but I don't see a record of this prescription.  Itching has improved with benadryl and ativan.  Continue steroid taper though it may be worsening her anxiety. No evidence of anaphylaxis.  Has epi pen at home and knows how to use it. Return precautions discussed.   I personally performed the services described in this documentation, which was scribed in my presence. The recorded information has been reviewed and is accurate.  Glynn OctaveStephen Hilario Robarts, MD 06/08/14 [phone]

## 2014-06-07 NOTE — ED Notes (Signed)
Pt c/o cont'd itching all over, swelling/burning to feet and hands, "tightness to my veins and chest" and SOB-pt with steady gait into triage and no resp distress-no swelling noted to hands or feet

## 2016-01-01 IMAGING — CR DG FOOT COMPLETE 3+V*R*
3 series · 3 of 3 positions shown · non-contrast
Comparison: None.

CLINICAL DATA: Pain following fall 1 day previously. Pain and
swelling predominantly lateral.

EXAM:
RIGHT FOOT COMPLETE - 3+ VIEW

[t foot ap right]
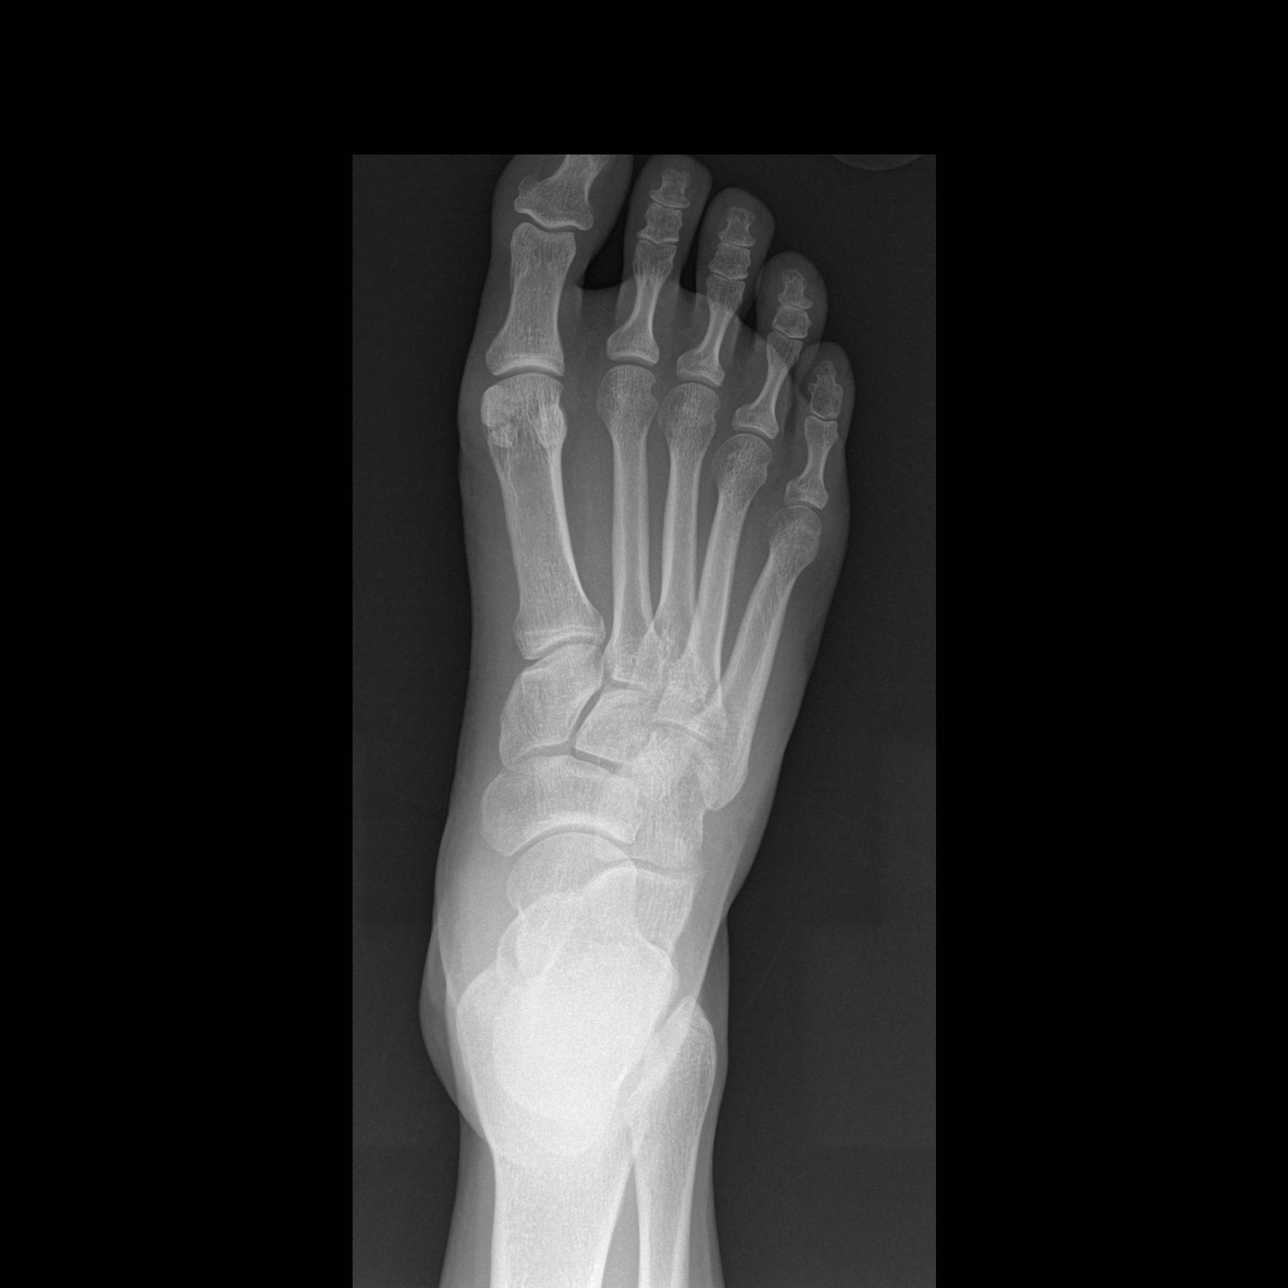

[t foot oblique right]
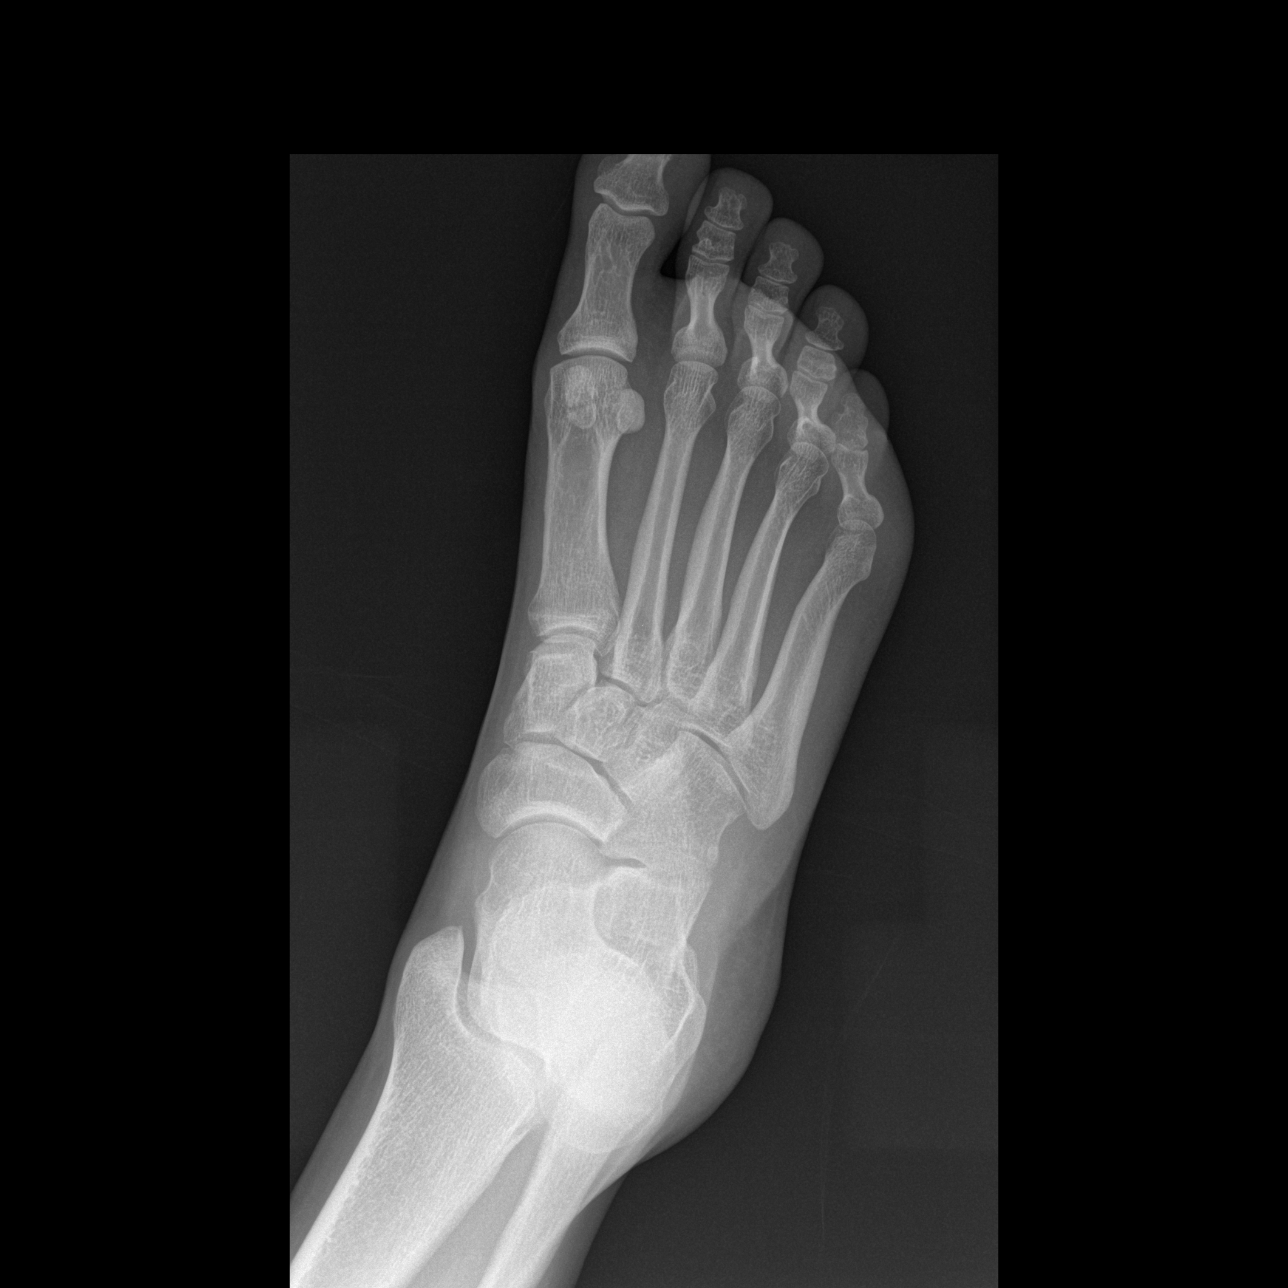

[t foot lat right]
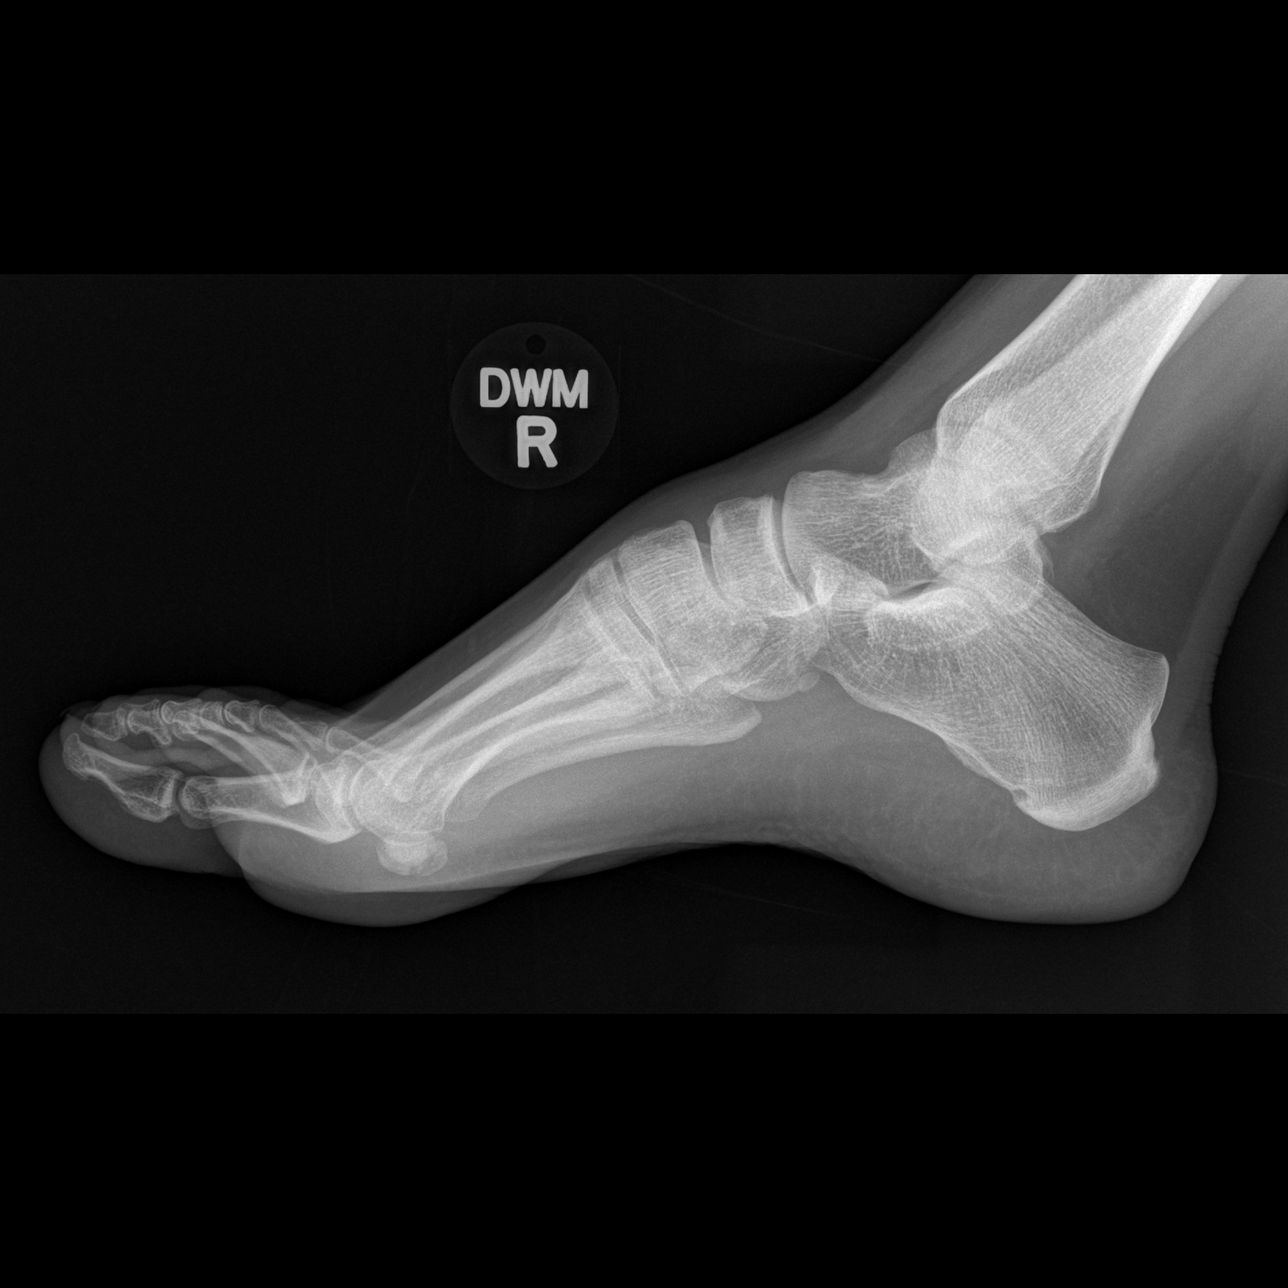

[3 of 3 positions shown; findings below may reference images not displayed]

FINDINGS: Frontal, oblique, and lateral views were obtained. There is an
apparent nondisplaced fracture along the junction of ankylosed fifth
middle and distal phalanges. The ankylosis is an anatomic variant.
The lucency in this area is oblique in orientation suggesting
fracture as opposed to a rudimentary joint space. No other
demonstrable fracture. No dislocation. Joint spaces appear intact.
IMPRESSION: Obliquely oriented fracture at the junction of the ankylosed middle
and distal phalanges of the fifth digit. No other fracture. No
dislocation. Joint spaces appear intact.

## 2016-04-03 DIAGNOSIS — Z01118 Encounter for examination of ears and hearing with other abnormal findings: Secondary | ICD-10-CM | POA: Diagnosis not present

## 2016-04-03 DIAGNOSIS — Z113 Encounter for screening for infections with a predominantly sexual mode of transmission: Secondary | ICD-10-CM | POA: Diagnosis not present

## 2016-04-03 DIAGNOSIS — Z136 Encounter for screening for cardiovascular disorders: Secondary | ICD-10-CM | POA: Diagnosis not present

## 2016-04-03 DIAGNOSIS — Z5181 Encounter for therapeutic drug level monitoring: Secondary | ICD-10-CM | POA: Diagnosis not present

## 2016-04-03 DIAGNOSIS — H538 Other visual disturbances: Secondary | ICD-10-CM | POA: Diagnosis not present

## 2016-04-03 DIAGNOSIS — Z0000 Encounter for general adult medical examination without abnormal findings: Secondary | ICD-10-CM | POA: Diagnosis not present

## 2016-04-03 DIAGNOSIS — F418 Other specified anxiety disorders: Secondary | ICD-10-CM | POA: Diagnosis not present

## 2016-04-03 DIAGNOSIS — G43909 Migraine, unspecified, not intractable, without status migrainosus: Secondary | ICD-10-CM | POA: Diagnosis not present

## 2016-04-03 DIAGNOSIS — Z1383 Encounter for screening for respiratory disorder NEC: Secondary | ICD-10-CM | POA: Diagnosis not present

## 2016-04-03 DIAGNOSIS — Z23 Encounter for immunization: Secondary | ICD-10-CM | POA: Diagnosis not present

## 2016-04-03 DIAGNOSIS — Z131 Encounter for screening for diabetes mellitus: Secondary | ICD-10-CM | POA: Diagnosis not present

## 2016-04-21 DIAGNOSIS — Z23 Encounter for immunization: Secondary | ICD-10-CM | POA: Diagnosis not present

## 2016-04-21 DIAGNOSIS — H538 Other visual disturbances: Secondary | ICD-10-CM | POA: Diagnosis not present

## 2016-04-21 DIAGNOSIS — Z01118 Encounter for examination of ears and hearing with other abnormal findings: Secondary | ICD-10-CM | POA: Diagnosis not present

## 2016-04-21 DIAGNOSIS — Z131 Encounter for screening for diabetes mellitus: Secondary | ICD-10-CM | POA: Diagnosis not present

## 2016-04-21 DIAGNOSIS — Z0000 Encounter for general adult medical examination without abnormal findings: Secondary | ICD-10-CM | POA: Diagnosis not present

## 2016-05-07 DIAGNOSIS — Z5181 Encounter for therapeutic drug level monitoring: Secondary | ICD-10-CM | POA: Diagnosis not present

## 2016-05-07 DIAGNOSIS — F418 Other specified anxiety disorders: Secondary | ICD-10-CM | POA: Diagnosis not present

## 2016-05-07 DIAGNOSIS — G43909 Migraine, unspecified, not intractable, without status migrainosus: Secondary | ICD-10-CM | POA: Diagnosis not present

## 2016-05-07 DIAGNOSIS — Z124 Encounter for screening for malignant neoplasm of cervix: Secondary | ICD-10-CM | POA: Diagnosis not present

## 2016-06-04 DIAGNOSIS — F418 Other specified anxiety disorders: Secondary | ICD-10-CM | POA: Diagnosis not present

## 2016-06-04 DIAGNOSIS — G43909 Migraine, unspecified, not intractable, without status migrainosus: Secondary | ICD-10-CM | POA: Diagnosis not present

## 2016-06-04 DIAGNOSIS — E669 Obesity, unspecified: Secondary | ICD-10-CM | POA: Diagnosis not present

## 2016-06-04 DIAGNOSIS — M797 Fibromyalgia: Secondary | ICD-10-CM | POA: Diagnosis not present

## 2016-07-09 DIAGNOSIS — F418 Other specified anxiety disorders: Secondary | ICD-10-CM | POA: Diagnosis not present

## 2016-07-09 DIAGNOSIS — E669 Obesity, unspecified: Secondary | ICD-10-CM | POA: Diagnosis not present

## 2016-07-09 DIAGNOSIS — G43909 Migraine, unspecified, not intractable, without status migrainosus: Secondary | ICD-10-CM | POA: Diagnosis not present

## 2016-07-09 DIAGNOSIS — M797 Fibromyalgia: Secondary | ICD-10-CM | POA: Diagnosis not present

## 2016-07-26 DIAGNOSIS — S51852A Open bite of left forearm, initial encounter: Secondary | ICD-10-CM | POA: Diagnosis not present

## 2016-07-26 DIAGNOSIS — W540XXA Bitten by dog, initial encounter: Secondary | ICD-10-CM | POA: Diagnosis not present

## 2016-08-06 DIAGNOSIS — G43909 Migraine, unspecified, not intractable, without status migrainosus: Secondary | ICD-10-CM | POA: Diagnosis not present

## 2016-08-06 DIAGNOSIS — E669 Obesity, unspecified: Secondary | ICD-10-CM | POA: Diagnosis not present

## 2016-08-06 DIAGNOSIS — F418 Other specified anxiety disorders: Secondary | ICD-10-CM | POA: Diagnosis not present

## 2016-08-06 DIAGNOSIS — M797 Fibromyalgia: Secondary | ICD-10-CM | POA: Diagnosis not present

## 2016-10-08 DIAGNOSIS — G43909 Migraine, unspecified, not intractable, without status migrainosus: Secondary | ICD-10-CM | POA: Diagnosis not present

## 2016-10-08 DIAGNOSIS — F418 Other specified anxiety disorders: Secondary | ICD-10-CM | POA: Diagnosis not present

## 2016-10-08 DIAGNOSIS — M797 Fibromyalgia: Secondary | ICD-10-CM | POA: Diagnosis not present

## 2016-10-08 DIAGNOSIS — R030 Elevated blood-pressure reading, without diagnosis of hypertension: Secondary | ICD-10-CM | POA: Diagnosis not present

## 2016-10-08 DIAGNOSIS — Z720 Tobacco use: Secondary | ICD-10-CM | POA: Diagnosis not present

## 2016-11-11 DIAGNOSIS — D235 Other benign neoplasm of skin of trunk: Secondary | ICD-10-CM | POA: Diagnosis not present

## 2016-12-03 DIAGNOSIS — R030 Elevated blood-pressure reading, without diagnosis of hypertension: Secondary | ICD-10-CM | POA: Diagnosis not present

## 2016-12-03 DIAGNOSIS — G43909 Migraine, unspecified, not intractable, without status migrainosus: Secondary | ICD-10-CM | POA: Diagnosis not present

## 2016-12-03 DIAGNOSIS — F418 Other specified anxiety disorders: Secondary | ICD-10-CM | POA: Diagnosis not present

## 2016-12-03 DIAGNOSIS — Z5181 Encounter for therapeutic drug level monitoring: Secondary | ICD-10-CM | POA: Diagnosis not present

## 2016-12-03 DIAGNOSIS — M797 Fibromyalgia: Secondary | ICD-10-CM | POA: Diagnosis not present

## 2017-01-13 DIAGNOSIS — M797 Fibromyalgia: Secondary | ICD-10-CM | POA: Diagnosis not present

## 2017-01-13 DIAGNOSIS — E669 Obesity, unspecified: Secondary | ICD-10-CM | POA: Diagnosis not present

## 2017-01-13 DIAGNOSIS — F418 Other specified anxiety disorders: Secondary | ICD-10-CM | POA: Diagnosis not present

## 2017-01-13 DIAGNOSIS — G43909 Migraine, unspecified, not intractable, without status migrainosus: Secondary | ICD-10-CM | POA: Diagnosis not present

## 2017-03-31 DIAGNOSIS — G43909 Migraine, unspecified, not intractable, without status migrainosus: Secondary | ICD-10-CM | POA: Diagnosis not present

## 2017-03-31 DIAGNOSIS — M797 Fibromyalgia: Secondary | ICD-10-CM | POA: Diagnosis not present

## 2017-03-31 DIAGNOSIS — F418 Other specified anxiety disorders: Secondary | ICD-10-CM | POA: Diagnosis not present

## 2017-03-31 DIAGNOSIS — E669 Obesity, unspecified: Secondary | ICD-10-CM | POA: Diagnosis not present

## 2017-05-12 DIAGNOSIS — Z5181 Encounter for therapeutic drug level monitoring: Secondary | ICD-10-CM | POA: Diagnosis not present

## 2017-05-12 DIAGNOSIS — E669 Obesity, unspecified: Secondary | ICD-10-CM | POA: Diagnosis not present

## 2017-05-12 DIAGNOSIS — G43909 Migraine, unspecified, not intractable, without status migrainosus: Secondary | ICD-10-CM | POA: Diagnosis not present

## 2017-05-12 DIAGNOSIS — F418 Other specified anxiety disorders: Secondary | ICD-10-CM | POA: Diagnosis not present

## 2017-05-12 DIAGNOSIS — M797 Fibromyalgia: Secondary | ICD-10-CM | POA: Diagnosis not present

## 2017-05-21 DIAGNOSIS — G4719 Other hypersomnia: Secondary | ICD-10-CM | POA: Diagnosis not present

## 2017-05-21 DIAGNOSIS — G4700 Insomnia, unspecified: Secondary | ICD-10-CM | POA: Diagnosis not present

## 2017-05-21 DIAGNOSIS — Z6838 Body mass index (BMI) 38.0-38.9, adult: Secondary | ICD-10-CM | POA: Diagnosis not present

## 2017-05-21 DIAGNOSIS — E6609 Other obesity due to excess calories: Secondary | ICD-10-CM | POA: Diagnosis not present

## 2017-05-26 DIAGNOSIS — G4710 Hypersomnia, unspecified: Secondary | ICD-10-CM | POA: Diagnosis not present

## 2017-05-26 DIAGNOSIS — E6609 Other obesity due to excess calories: Secondary | ICD-10-CM | POA: Diagnosis not present

## 2017-05-26 DIAGNOSIS — Z6838 Body mass index (BMI) 38.0-38.9, adult: Secondary | ICD-10-CM | POA: Diagnosis not present

## 2017-06-01 DIAGNOSIS — G4700 Insomnia, unspecified: Secondary | ICD-10-CM | POA: Diagnosis not present

## 2017-06-01 DIAGNOSIS — G4710 Hypersomnia, unspecified: Secondary | ICD-10-CM | POA: Diagnosis not present

## 2017-08-12 DIAGNOSIS — M25579 Pain in unspecified ankle and joints of unspecified foot: Secondary | ICD-10-CM | POA: Diagnosis not present

## 2017-08-18 DIAGNOSIS — Z131 Encounter for screening for diabetes mellitus: Secondary | ICD-10-CM | POA: Diagnosis not present

## 2017-08-18 DIAGNOSIS — R635 Abnormal weight gain: Secondary | ICD-10-CM | POA: Diagnosis not present

## 2017-08-18 DIAGNOSIS — Z0110 Encounter for examination of ears and hearing without abnormal findings: Secondary | ICD-10-CM | POA: Diagnosis not present

## 2017-08-18 DIAGNOSIS — M797 Fibromyalgia: Secondary | ICD-10-CM | POA: Diagnosis not present

## 2017-08-18 DIAGNOSIS — F418 Other specified anxiety disorders: Secondary | ICD-10-CM | POA: Diagnosis not present

## 2017-08-18 DIAGNOSIS — E669 Obesity, unspecified: Secondary | ICD-10-CM | POA: Diagnosis not present

## 2017-08-18 DIAGNOSIS — G43909 Migraine, unspecified, not intractable, without status migrainosus: Secondary | ICD-10-CM | POA: Diagnosis not present

## 2017-08-18 DIAGNOSIS — Z113 Encounter for screening for infections with a predominantly sexual mode of transmission: Secondary | ICD-10-CM | POA: Diagnosis not present

## 2017-08-18 DIAGNOSIS — Z0000 Encounter for general adult medical examination without abnormal findings: Secondary | ICD-10-CM | POA: Diagnosis not present

## 2017-09-29 ENCOUNTER — Encounter: Payer: Self-pay | Admitting: Podiatry

## 2017-09-29 ENCOUNTER — Ambulatory Visit: Payer: BLUE CROSS/BLUE SHIELD | Admitting: Podiatry

## 2017-09-29 VITALS — BP 132/87 | HR 83 | Ht 62.0 in | Wt 215.0 lb

## 2017-09-29 DIAGNOSIS — M216X1 Other acquired deformities of right foot: Secondary | ICD-10-CM | POA: Diagnosis not present

## 2017-09-29 DIAGNOSIS — M659 Synovitis and tenosynovitis, unspecified: Secondary | ICD-10-CM | POA: Diagnosis not present

## 2017-09-29 DIAGNOSIS — F418 Other specified anxiety disorders: Secondary | ICD-10-CM | POA: Diagnosis not present

## 2017-09-29 DIAGNOSIS — M21969 Unspecified acquired deformity of unspecified lower leg: Secondary | ICD-10-CM | POA: Diagnosis not present

## 2017-09-29 DIAGNOSIS — M65979 Unspecified synovitis and tenosynovitis, unspecified ankle and foot: Secondary | ICD-10-CM

## 2017-09-29 DIAGNOSIS — M24573 Contracture, unspecified ankle: Secondary | ICD-10-CM | POA: Diagnosis not present

## 2017-09-29 DIAGNOSIS — M216X2 Other acquired deformities of left foot: Secondary | ICD-10-CM

## 2017-09-29 DIAGNOSIS — M797 Fibromyalgia: Secondary | ICD-10-CM | POA: Diagnosis not present

## 2017-09-29 DIAGNOSIS — E669 Obesity, unspecified: Secondary | ICD-10-CM | POA: Diagnosis not present

## 2017-09-29 DIAGNOSIS — G43909 Migraine, unspecified, not intractable, without status migrainosus: Secondary | ICD-10-CM | POA: Diagnosis not present

## 2017-09-29 NOTE — Patient Instructions (Signed)
Seen for bilateral foot pain. Noted of tight Achilles tendon and weak first metatarsal bone bilateral. Reviewed stretch exercise. Placed in Equinus brace. Need to wear daily for an hour as instructed. Will get benefit info on Custom orthotics.

## 2017-09-29 NOTE — Progress Notes (Signed)
SUBJECTIVE: 32 y.o. year old female presents complaining of bilateral foot pain L>R. Initial pain started 6-7 years ago and recently pain is more frequent and have to miss work at times.  On feet at work 5-9 hours at work. Pain is at center dorsum along the 2nd MCJ area extend to anterior ankle area. Hurts at the end of the day and when wake up. Has been checked out with x-ray without definitive answer.  Review of Systems  Constitutional: Negative.   HENT: Negative.   Eyes: Negative.   Respiratory: Negative.   Cardiovascular: Negative.   Gastrointestinal: Negative.   Genitourinary: Negative.   Musculoskeletal:       Small joints in foot, hand, back hurts from Fibromyalgia.  Skin: Negative.   Neurological:       Fibromyalgia diagnosed several years ago.     OBJECTIVE: DERMATOLOGIC EXAMINATION: No abnormal skin lesions noted. All nails are pained in polish.  VASCULAR EXAMINATION OF LOWER LIMBS: All pedal pulses are palpable with normal pulsation.  Capillary Filling times within 3 seconds in all digits.  No edema or erythema noted. Temperature gradient from tibial crest to dorsum of foot is within normal bilateral.  NEUROLOGIC EXAMINATION OF THE LOWER LIMBS: All epicritic and tactile sensations grossly intact. Sharp and Dull discriminatory sensations at the plantar ball of hallux is intact bilateral.   MUSCULOSKELETAL EXAMINATION: Positive for high arched cavus type foot. Rearfoot varus. Tight Achilles tendon. Unable to dorsiflex beyond 90 degrees bilateral. Excess sagittal plane motion of the first ray bilateral.   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 Tenosynovitis mid foot bilateral.  PLAN: Reviewed findings and available treatment options. Reviewed stretch exercise to practice daily for tight Achilles tendon. Equinus brace dispensed with instructions (W0981(L4396, L2210). Angle set for full  dorsiflexion position. May benefit from custom orthotics (L2030). Will call for benefit into.  Equinus Brace Fitting and Dispensing Document: The plastic custom fitted Ankle Foot Orthosis was assembled in accordance with the manufacturer's instructions and based on the specific anatomical and physiological requirements of the patient. This device was custom fitted by me with expertise to provide this service and dispensed for the right foot. The patient was examined while wearing the device after several fitting maneuvers of trimming, bending, shaping, etc. and the fit was found to be appropriate.    Medical necessity: Due to the pain in the foot/ankle/leg with weight bearing throughout the day, with diagnosis of equinus deformity and related symptoms, this is medically necessary for the treatment.  The function of this device is to serve as an anti contracture device of the Gastrocsoleal complex and to restrict and limit motion and help reduce excessive stress and strain to First[employer] Corpastrocsoleal complex and foot/ankle/leg. It is being utilized to prevent the plantar contracture of the Gastrocsoleal complex.   The goal of the therapy are to:  1. Treat plantarflexion contracture of the ankle with dorsiflexion, the ankle on passive range of motion testing is noted to have at least 10 degrees (I.e, a non-fixed contracture). 2. Provide a reasonable expectation of the ability to correct the contracture.  3. Reduce the significantly with the beneficiary's functional abilities.  4. Used as a component of a therapy program which includes active stretching of the involved muscles and/or tendons. 5. Treat the beneficiary's plantar fasciitis. Additionally, medial and lateral ankle dorsiflex assistive and plantarflex restraint hinges are included on the brace and were set at 90 degrees of dorsiflexion. These will be periodically  adjusted based on future physical examination of the patient's progress.  Instructions and  Goals for the Patient: The device will be utilized for the next 8-12 weeks. The intent of these hinges is to resist plantarflexion and assist with dorsiflexion of the Gastrocsoleal complex and/or plantar fascia. These hinges will be adjusted over the course of the patient's therapy. The patient was instructed not to adjust the hinges. Patient was advised to bring the device to the office on next visit for further evaluation and adjustments.  The goals and function of this device was explained in detail to the patient. The patient stated that the device was comfortable when applied. The patient was shown and told in detail how to properly apply, remove, wear, and care for the device. The patient was able to apply and remove the device properly without assistance. Patient as advised not to ambulate with the device in place.  The device was then dispensed and was suitable for the condition and not substandard. No guarantees regarding resolution of symptoms were given and precautions were reviewed.  Written instructions and warranty information were given along with the list of the current Durable Medical Equipment Supplier Standards.  The patient signed written proof of delivery. All questions were answered to the patient's satisfaction. The patent also given a follow up appointment in one month.

## 2017-10-02 ENCOUNTER — Telehealth: Payer: Self-pay | Admitting: *Deleted

## 2017-10-02 NOTE — Telephone Encounter (Signed)
For CPT code L3020 the benefits are  If the orthotics are billed with an office visit then the patient pays copay and orthotics are covered at 100%. Reference # is [id number]. Left message for patient to call back

## 2017-10-13 DIAGNOSIS — F418 Other specified anxiety disorders: Secondary | ICD-10-CM | POA: Diagnosis not present

## 2017-10-13 DIAGNOSIS — G43909 Migraine, unspecified, not intractable, without status migrainosus: Secondary | ICD-10-CM | POA: Diagnosis not present

## 2017-10-13 DIAGNOSIS — M797 Fibromyalgia: Secondary | ICD-10-CM | POA: Diagnosis not present

## 2017-10-13 DIAGNOSIS — E669 Obesity, unspecified: Secondary | ICD-10-CM | POA: Diagnosis not present

## 2017-11-04 ENCOUNTER — Ambulatory Visit: Payer: BLUE CROSS/BLUE SHIELD | Admitting: Podiatry

## 2017-11-04 ENCOUNTER — Encounter: Payer: Self-pay | Admitting: Podiatry

## 2017-11-04 DIAGNOSIS — M21962 Unspecified acquired deformity of left lower leg: Secondary | ICD-10-CM

## 2017-11-04 DIAGNOSIS — M216X2 Other acquired deformities of left foot: Secondary | ICD-10-CM | POA: Diagnosis not present

## 2017-11-04 DIAGNOSIS — M21961 Unspecified acquired deformity of right lower leg: Secondary | ICD-10-CM | POA: Diagnosis not present

## 2017-11-04 DIAGNOSIS — M659 Synovitis and tenosynovitis, unspecified: Secondary | ICD-10-CM

## 2017-11-04 DIAGNOSIS — M216X1 Other acquired deformities of right foot: Secondary | ICD-10-CM | POA: Diagnosis not present

## 2017-11-04 NOTE — Patient Instructions (Signed)
Both feet casted for orthotics. Continue with current level setting on Equinus brace.

## 2017-11-04 NOTE — Progress Notes (Signed)
Subjective: 32 y.o. year old female patient presents requesting for Custom orthotics to be made. Patient was treated with Equinus brace for tight Achilles tendon and Tenosynovitis mid foot bilateral on 09/29/17. Still not able to use a full hour.Started to use on both legs. Patient is able to tell decreasing pain in left foot after been using the brace.  Objective: Dermatologic: New blistered skin in ball of both feet from a pair of new shoes. Vascular: Pedal pulses are all palpable. Orthopedic: High arched cavus type foot. Rear foot varus, Tight Achilles tendon bilateral, excess sagittal plane motion of the first ray bilateral. Neurologic: All epicritic and tactile sensations grossly intact.  Radiographic examination reveal  AP view: Adducted lesser metatarsal bone  Lateral: Supinated foot with elevated first ray right  Assessment: Metatarsus primus elevatus bilateral. Compensated rearfoot varus bilateral. Ankle equinus bilateral. Tenosynovitis mid foot bilateral.  Treatment: Both feet casted for Orthotics. Continue with Equinus brace with current setting.

## 2017-11-05 DIAGNOSIS — M5411 Radiculopathy, occipito-atlanto-axial region: Secondary | ICD-10-CM | POA: Diagnosis not present

## 2017-11-05 DIAGNOSIS — M5412 Radiculopathy, cervical region: Secondary | ICD-10-CM | POA: Diagnosis not present

## 2017-11-05 DIAGNOSIS — R51 Headache: Secondary | ICD-10-CM | POA: Diagnosis not present

## 2017-11-05 DIAGNOSIS — M531 Cervicobrachial syndrome: Secondary | ICD-10-CM | POA: Diagnosis not present

## 2017-11-09 DIAGNOSIS — M531 Cervicobrachial syndrome: Secondary | ICD-10-CM | POA: Diagnosis not present

## 2017-11-09 DIAGNOSIS — M5411 Radiculopathy, occipito-atlanto-axial region: Secondary | ICD-10-CM | POA: Diagnosis not present

## 2017-11-09 DIAGNOSIS — R51 Headache: Secondary | ICD-10-CM | POA: Diagnosis not present

## 2017-11-09 DIAGNOSIS — M5412 Radiculopathy, cervical region: Secondary | ICD-10-CM | POA: Diagnosis not present

## 2017-11-18 DIAGNOSIS — M5412 Radiculopathy, cervical region: Secondary | ICD-10-CM | POA: Diagnosis not present

## 2017-11-18 DIAGNOSIS — M531 Cervicobrachial syndrome: Secondary | ICD-10-CM | POA: Diagnosis not present

## 2017-11-18 DIAGNOSIS — R51 Headache: Secondary | ICD-10-CM | POA: Diagnosis not present

## 2017-11-18 DIAGNOSIS — M5411 Radiculopathy, occipito-atlanto-axial region: Secondary | ICD-10-CM | POA: Diagnosis not present

## 2017-11-19 DIAGNOSIS — M5411 Radiculopathy, occipito-atlanto-axial region: Secondary | ICD-10-CM | POA: Diagnosis not present

## 2017-11-19 DIAGNOSIS — M531 Cervicobrachial syndrome: Secondary | ICD-10-CM | POA: Diagnosis not present

## 2017-11-19 DIAGNOSIS — R51 Headache: Secondary | ICD-10-CM | POA: Diagnosis not present

## 2017-11-19 DIAGNOSIS — M5412 Radiculopathy, cervical region: Secondary | ICD-10-CM | POA: Diagnosis not present

## 2017-11-20 DIAGNOSIS — M5412 Radiculopathy, cervical region: Secondary | ICD-10-CM | POA: Diagnosis not present

## 2017-11-20 DIAGNOSIS — M5411 Radiculopathy, occipito-atlanto-axial region: Secondary | ICD-10-CM | POA: Diagnosis not present

## 2017-11-20 DIAGNOSIS — R51 Headache: Secondary | ICD-10-CM | POA: Diagnosis not present

## 2017-11-20 DIAGNOSIS — M531 Cervicobrachial syndrome: Secondary | ICD-10-CM | POA: Diagnosis not present

## 2018-01-12 DIAGNOSIS — G43909 Migraine, unspecified, not intractable, without status migrainosus: Secondary | ICD-10-CM | POA: Diagnosis not present

## 2018-01-12 DIAGNOSIS — M797 Fibromyalgia: Secondary | ICD-10-CM | POA: Diagnosis not present

## 2018-01-12 DIAGNOSIS — N926 Irregular menstruation, unspecified: Secondary | ICD-10-CM | POA: Diagnosis not present

## 2018-01-12 DIAGNOSIS — F418 Other specified anxiety disorders: Secondary | ICD-10-CM | POA: Diagnosis not present

## 2018-02-02 ENCOUNTER — Telehealth: Payer: Self-pay

## 2018-02-02 DIAGNOSIS — G43909 Migraine, unspecified, not intractable, without status migrainosus: Secondary | ICD-10-CM | POA: Diagnosis not present

## 2018-02-02 DIAGNOSIS — F418 Other specified anxiety disorders: Secondary | ICD-10-CM | POA: Diagnosis not present

## 2018-02-02 DIAGNOSIS — R109 Unspecified abdominal pain: Secondary | ICD-10-CM | POA: Diagnosis not present

## 2018-02-02 DIAGNOSIS — T148XXA Other injury of unspecified body region, initial encounter: Secondary | ICD-10-CM | POA: Diagnosis not present

## 2018-02-02 DIAGNOSIS — N926 Irregular menstruation, unspecified: Secondary | ICD-10-CM | POA: Diagnosis not present

## 2018-02-02 NOTE — Telephone Encounter (Signed)
Left VM advising that orthotics be picked up ASAP due to practice closing. VM was was left on 12/22/17 that orthotics were ready for pick up.

## 2018-03-09 DIAGNOSIS — M2550 Pain in unspecified joint: Secondary | ICD-10-CM | POA: Diagnosis not present

## 2018-03-09 DIAGNOSIS — F418 Other specified anxiety disorders: Secondary | ICD-10-CM | POA: Diagnosis not present

## 2018-03-09 DIAGNOSIS — M797 Fibromyalgia: Secondary | ICD-10-CM | POA: Diagnosis not present

## 2018-03-09 DIAGNOSIS — G43909 Migraine, unspecified, not intractable, without status migrainosus: Secondary | ICD-10-CM | POA: Diagnosis not present

## 2018-03-09 DIAGNOSIS — N926 Irregular menstruation, unspecified: Secondary | ICD-10-CM | POA: Diagnosis not present

## 2018-05-11 DIAGNOSIS — N926 Irregular menstruation, unspecified: Secondary | ICD-10-CM | POA: Diagnosis not present

## 2018-05-11 DIAGNOSIS — I10 Essential (primary) hypertension: Secondary | ICD-10-CM | POA: Diagnosis not present

## 2018-05-11 DIAGNOSIS — G43909 Migraine, unspecified, not intractable, without status migrainosus: Secondary | ICD-10-CM | POA: Diagnosis not present

## 2018-05-11 DIAGNOSIS — F418 Other specified anxiety disorders: Secondary | ICD-10-CM | POA: Diagnosis not present

## 2018-05-18 DIAGNOSIS — M25562 Pain in left knee: Secondary | ICD-10-CM | POA: Diagnosis not present

## 2018-05-18 DIAGNOSIS — I10 Essential (primary) hypertension: Secondary | ICD-10-CM | POA: Diagnosis not present

## 2018-05-18 DIAGNOSIS — F418 Other specified anxiety disorders: Secondary | ICD-10-CM | POA: Diagnosis not present

## 2018-05-18 DIAGNOSIS — N926 Irregular menstruation, unspecified: Secondary | ICD-10-CM | POA: Diagnosis not present

## 2018-07-26 DIAGNOSIS — I10 Essential (primary) hypertension: Secondary | ICD-10-CM | POA: Diagnosis not present

## 2018-07-26 DIAGNOSIS — T148XXA Other injury of unspecified body region, initial encounter: Secondary | ICD-10-CM | POA: Diagnosis not present

## 2018-07-26 DIAGNOSIS — F418 Other specified anxiety disorders: Secondary | ICD-10-CM | POA: Diagnosis not present

## 2018-07-26 DIAGNOSIS — N926 Irregular menstruation, unspecified: Secondary | ICD-10-CM | POA: Diagnosis not present

## 2018-11-02 DIAGNOSIS — F418 Other specified anxiety disorders: Secondary | ICD-10-CM | POA: Diagnosis not present

## 2018-11-02 DIAGNOSIS — G43909 Migraine, unspecified, not intractable, without status migrainosus: Secondary | ICD-10-CM | POA: Diagnosis not present

## 2018-11-02 DIAGNOSIS — Z0000 Encounter for general adult medical examination without abnormal findings: Secondary | ICD-10-CM | POA: Diagnosis not present

## 2018-11-02 DIAGNOSIS — N926 Irregular menstruation, unspecified: Secondary | ICD-10-CM | POA: Diagnosis not present

## 2018-11-02 DIAGNOSIS — Z131 Encounter for screening for diabetes mellitus: Secondary | ICD-10-CM | POA: Diagnosis not present

## 2018-11-02 DIAGNOSIS — I10 Essential (primary) hypertension: Secondary | ICD-10-CM | POA: Diagnosis not present

## 2018-11-02 DIAGNOSIS — Z5181 Encounter for therapeutic drug level monitoring: Secondary | ICD-10-CM | POA: Diagnosis not present

## 2018-11-02 DIAGNOSIS — Z113 Encounter for screening for infections with a predominantly sexual mode of transmission: Secondary | ICD-10-CM | POA: Diagnosis not present

## 2018-11-09 DIAGNOSIS — R7989 Other specified abnormal findings of blood chemistry: Secondary | ICD-10-CM | POA: Diagnosis not present

## 2018-11-15 ENCOUNTER — Other Ambulatory Visit: Payer: Self-pay | Admitting: Internal Medicine

## 2018-11-15 DIAGNOSIS — R7989 Other specified abnormal findings of blood chemistry: Secondary | ICD-10-CM

## 2018-11-30 ENCOUNTER — Ambulatory Visit
Admission: RE | Admit: 2018-11-30 | Discharge: 2018-11-30 | Disposition: A | Discharge status: Home / Self Care | Payer: BC Managed Care – PPO | Source: Ambulatory Visit | Attending: Internal Medicine | Admitting: Internal Medicine

## 2018-11-30 DIAGNOSIS — K760 Fatty (change of) liver, not elsewhere classified: Secondary | ICD-10-CM | POA: Diagnosis not present

## 2018-11-30 DIAGNOSIS — R7989 Other specified abnormal findings of blood chemistry: Secondary | ICD-10-CM

## 2018-12-07 DIAGNOSIS — L410 Pityriasis lichenoides et varioliformis acuta: Secondary | ICD-10-CM | POA: Diagnosis not present

## 2019-02-17 DIAGNOSIS — N926 Irregular menstruation, unspecified: Secondary | ICD-10-CM | POA: Diagnosis not present

## 2019-02-17 DIAGNOSIS — F418 Other specified anxiety disorders: Secondary | ICD-10-CM | POA: Diagnosis not present

## 2019-02-17 DIAGNOSIS — G43909 Migraine, unspecified, not intractable, without status migrainosus: Secondary | ICD-10-CM | POA: Diagnosis not present

## 2019-02-17 DIAGNOSIS — I10 Essential (primary) hypertension: Secondary | ICD-10-CM | POA: Diagnosis not present

## 2019-02-24 DIAGNOSIS — L410 Pityriasis lichenoides et varioliformis acuta: Secondary | ICD-10-CM | POA: Diagnosis not present

## 2019-02-24 DIAGNOSIS — D2361 Other benign neoplasm of skin of right upper limb, including shoulder: Secondary | ICD-10-CM | POA: Diagnosis not present

## 2019-02-24 DIAGNOSIS — L7211 Pilar cyst: Secondary | ICD-10-CM | POA: Diagnosis not present

## 2019-02-28 DIAGNOSIS — L7211 Pilar cyst: Secondary | ICD-10-CM | POA: Diagnosis not present

## 2019-02-28 DIAGNOSIS — D2361 Other benign neoplasm of skin of right upper limb, including shoulder: Secondary | ICD-10-CM | POA: Diagnosis not present

## 2019-03-07 DIAGNOSIS — D239 Other benign neoplasm of skin, unspecified: Secondary | ICD-10-CM | POA: Diagnosis not present

## 2019-03-07 DIAGNOSIS — L7211 Pilar cyst: Secondary | ICD-10-CM | POA: Diagnosis not present

## 2019-03-07 DIAGNOSIS — Z4802 Encounter for removal of sutures: Secondary | ICD-10-CM | POA: Diagnosis not present

## 2019-06-23 DIAGNOSIS — Z5181 Encounter for therapeutic drug level monitoring: Secondary | ICD-10-CM | POA: Diagnosis not present

## 2019-06-23 DIAGNOSIS — N926 Irregular menstruation, unspecified: Secondary | ICD-10-CM | POA: Diagnosis not present

## 2019-06-23 DIAGNOSIS — G43909 Migraine, unspecified, not intractable, without status migrainosus: Secondary | ICD-10-CM | POA: Diagnosis not present

## 2019-06-23 DIAGNOSIS — M25562 Pain in left knee: Secondary | ICD-10-CM | POA: Diagnosis not present

## 2019-06-23 DIAGNOSIS — M2550 Pain in unspecified joint: Secondary | ICD-10-CM | POA: Diagnosis not present

## 2019-06-23 DIAGNOSIS — I10 Essential (primary) hypertension: Secondary | ICD-10-CM | POA: Diagnosis not present

## 2019-06-23 DIAGNOSIS — F418 Other specified anxiety disorders: Secondary | ICD-10-CM | POA: Diagnosis not present

## 2019-07-12 DIAGNOSIS — M25462 Effusion, left knee: Secondary | ICD-10-CM | POA: Diagnosis not present

## 2019-07-12 DIAGNOSIS — M2242 Chondromalacia patellae, left knee: Secondary | ICD-10-CM | POA: Diagnosis not present

## 2019-07-21 DIAGNOSIS — Z124 Encounter for screening for malignant neoplasm of cervix: Secondary | ICD-10-CM | POA: Diagnosis not present

## 2019-07-21 DIAGNOSIS — F418 Other specified anxiety disorders: Secondary | ICD-10-CM | POA: Diagnosis not present

## 2019-07-21 DIAGNOSIS — I10 Essential (primary) hypertension: Secondary | ICD-10-CM | POA: Diagnosis not present

## 2019-07-21 DIAGNOSIS — Z113 Encounter for screening for infections with a predominantly sexual mode of transmission: Secondary | ICD-10-CM | POA: Diagnosis not present

## 2019-07-21 DIAGNOSIS — N926 Irregular menstruation, unspecified: Secondary | ICD-10-CM | POA: Diagnosis not present

## 2019-11-10 DIAGNOSIS — N926 Irregular menstruation, unspecified: Secondary | ICD-10-CM | POA: Diagnosis not present

## 2019-11-10 DIAGNOSIS — I10 Essential (primary) hypertension: Secondary | ICD-10-CM | POA: Diagnosis not present

## 2019-11-10 DIAGNOSIS — F418 Other specified anxiety disorders: Secondary | ICD-10-CM | POA: Diagnosis not present

## 2019-11-10 DIAGNOSIS — G43909 Migraine, unspecified, not intractable, without status migrainosus: Secondary | ICD-10-CM | POA: Diagnosis not present

## 2020-02-01 DIAGNOSIS — M2242 Chondromalacia patellae, left knee: Secondary | ICD-10-CM | POA: Diagnosis not present

## 2020-02-01 DIAGNOSIS — M25561 Pain in right knee: Secondary | ICD-10-CM | POA: Diagnosis not present

## 2020-02-01 DIAGNOSIS — M25462 Effusion, left knee: Secondary | ICD-10-CM | POA: Diagnosis not present

## 2020-03-12 DIAGNOSIS — M67462 Ganglion, left knee: Secondary | ICD-10-CM | POA: Diagnosis not present

## 2020-03-12 DIAGNOSIS — M1732 Unilateral post-traumatic osteoarthritis, left knee: Secondary | ICD-10-CM | POA: Diagnosis not present

## 2020-08-10 IMAGING — US ULTRASOUND ABDOMEN LIMITED
1 series · 14 of 25 positions shown · non-contrast
Comparison: None.

CLINICAL DATA: Elevated liver enzymes

EXAM:
ULTRASOUND ABDOMEN LIMITED RIGHT UPPER QUADRANT

[Series 1: ultrasound abdomen limited · 0.22mm/px · 14 of 42 slices shown]
[im 1/42]
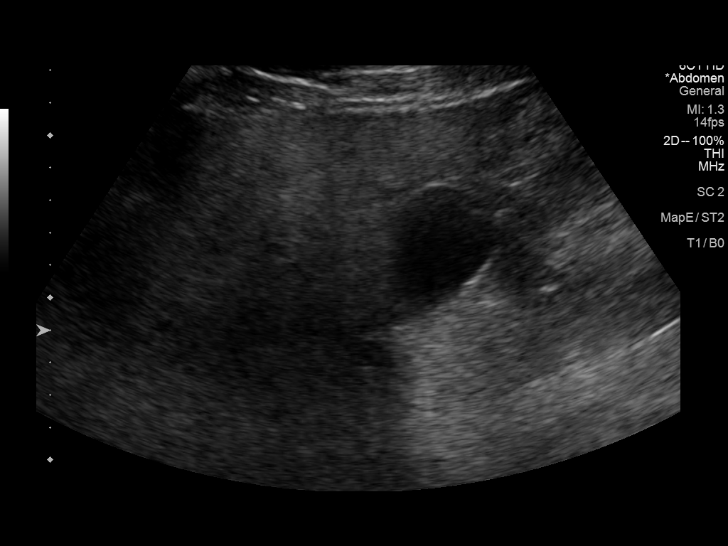
[im 4/42]
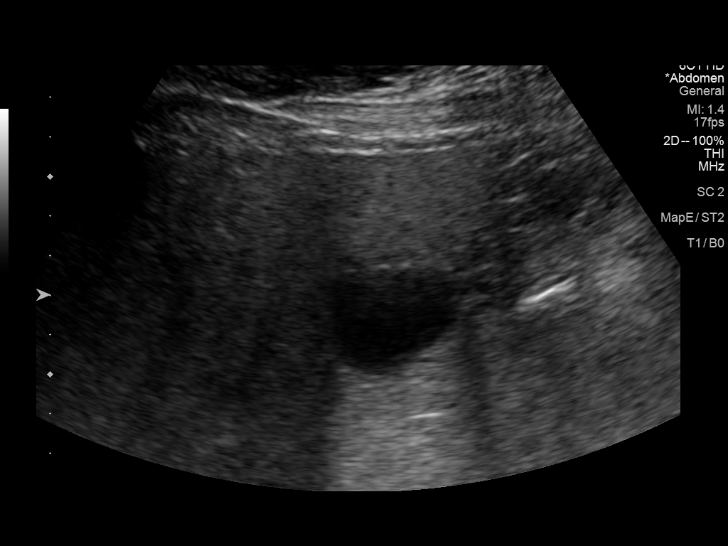
[im 7/42]
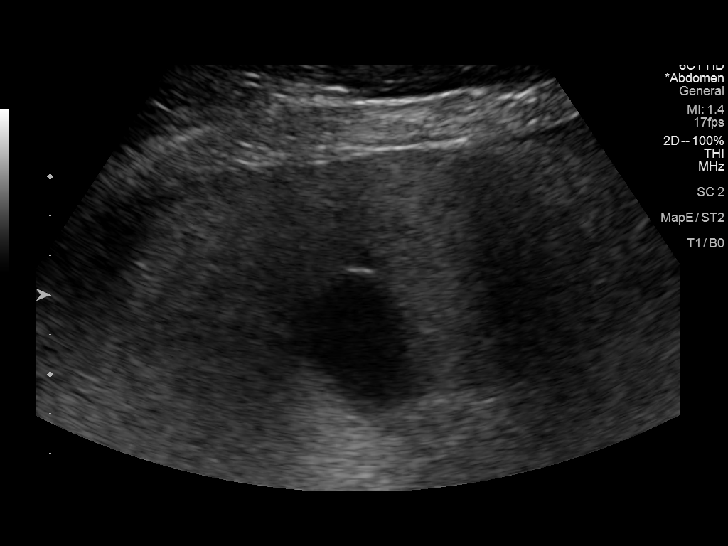
[im 11/42]
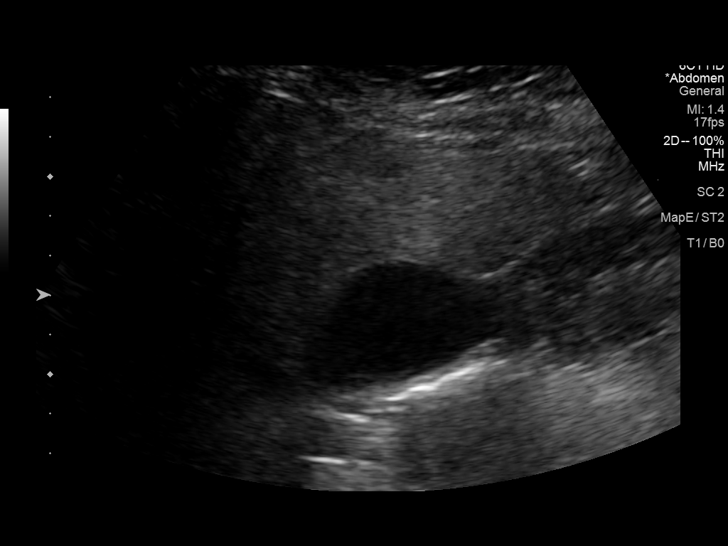
[im 14/42]
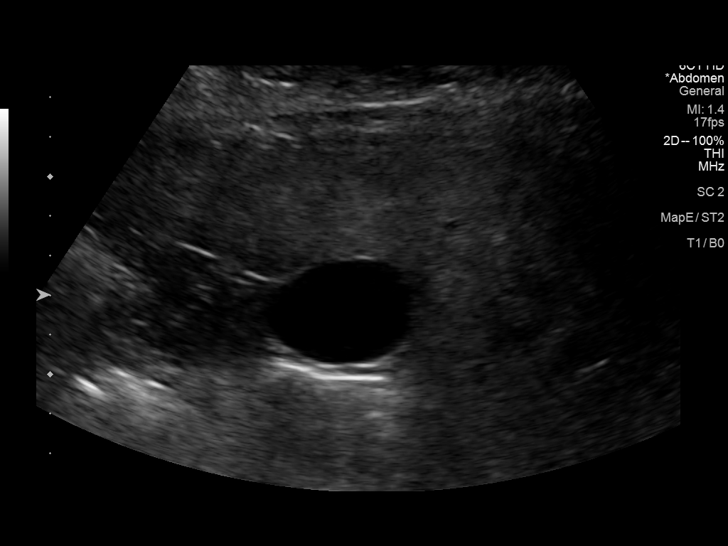
[im 16/42]
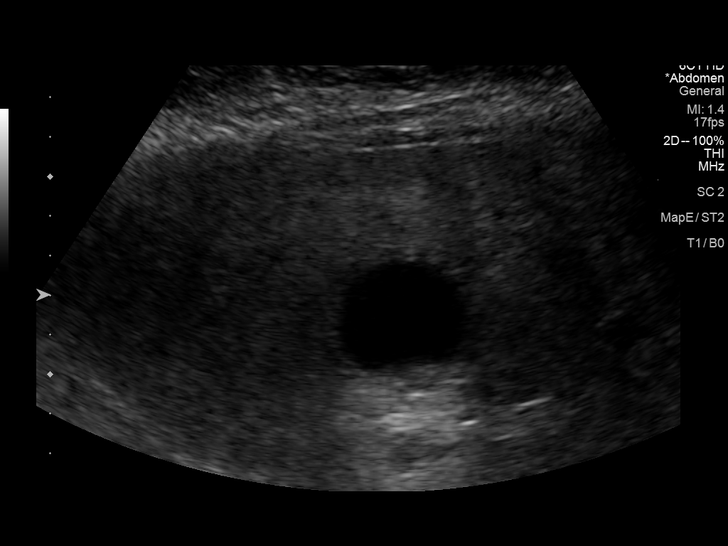
[im 19/42]
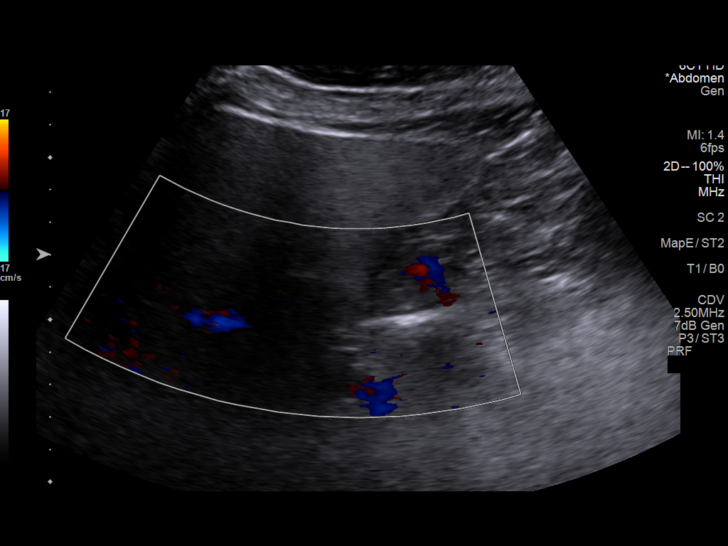
[im 23/42]
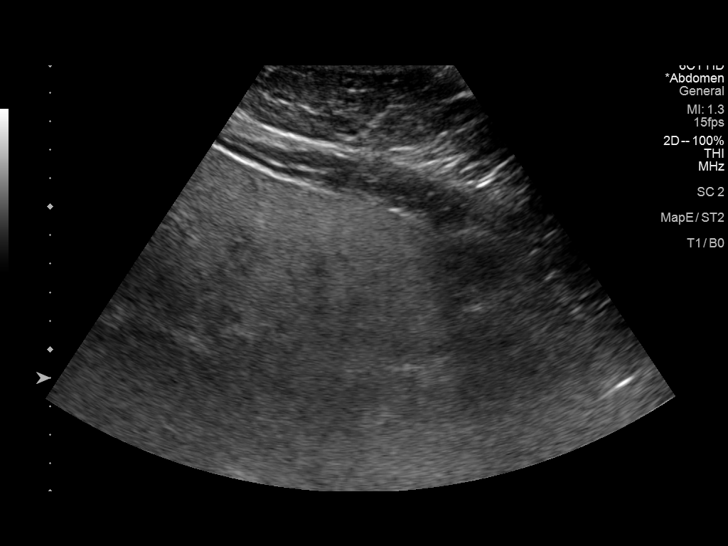
[im 26/42]
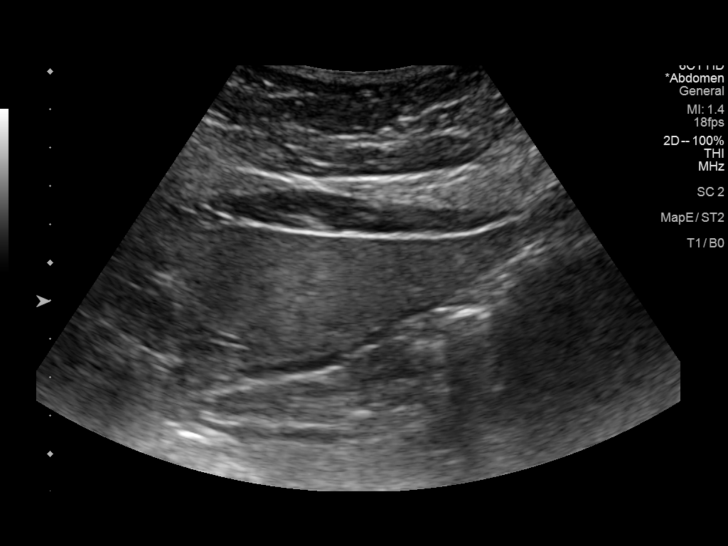
[im 28/42]
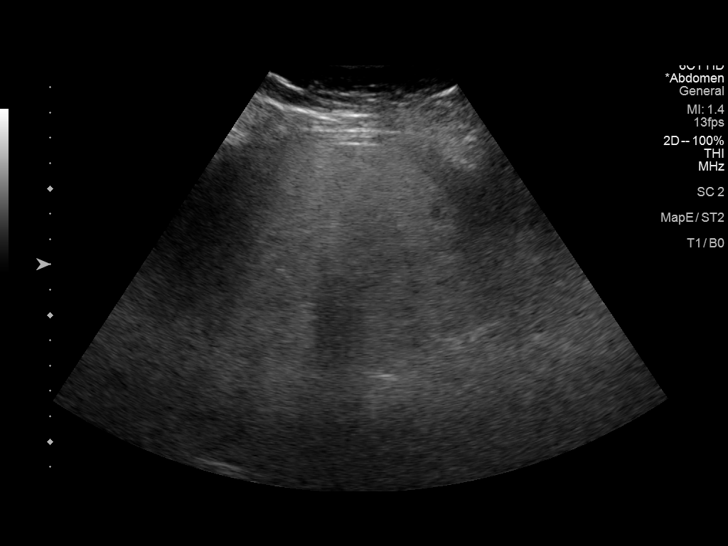
[im 31/42]
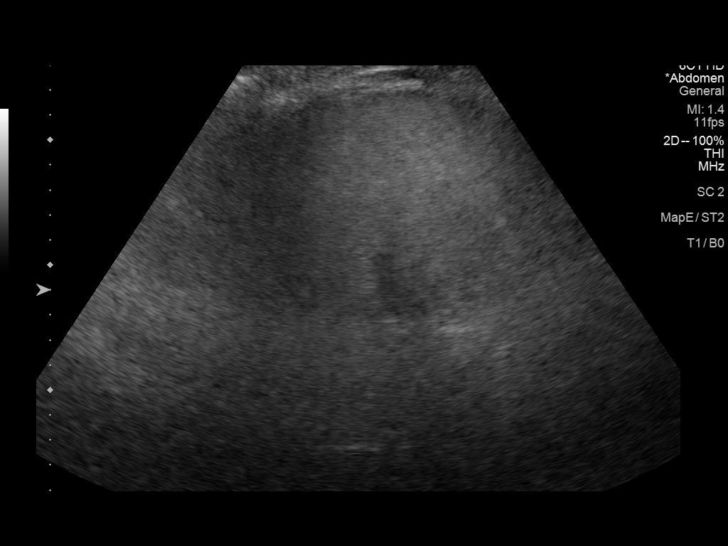
[im 35/42]
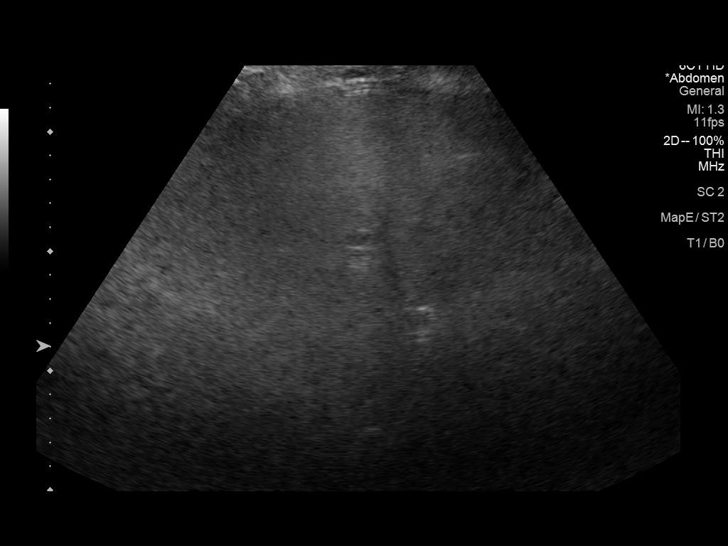
[im 38/42]
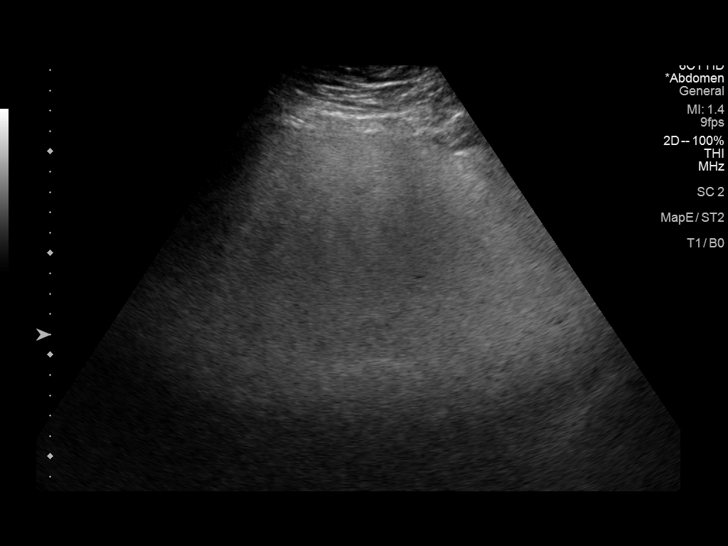
[im 42/42]
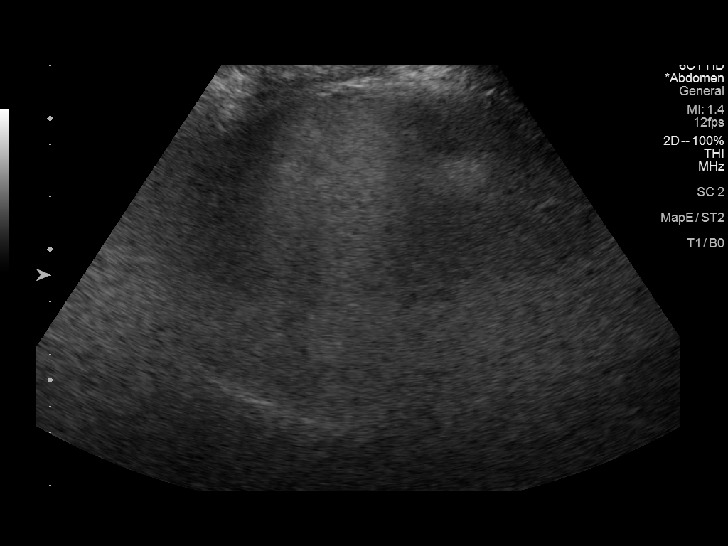

[14 of 25 positions shown; findings below may reference images not displayed]

FINDINGS: Gallbladder:

No gallstones or wall thickening visualized. There is no
pericholecystic fluid. No sonographic Murphy sign noted by
sonographer.

Common bile duct:

Diameter: 4 mm. No intrahepatic or extrahepatic biliary duct
dilatation.

Liver:

No focal lesion identified. Liver echogenicity is increased
diffusely. Portal vein is patent on color Doppler imaging with
normal direction of blood flow towards the liver.

Other: None.
IMPRESSION: Diffuse increase in liver echogenicity, a finding indicative of
hepatic steatosis. While no focal liver lesions are evident on this
study, it must be cautioned that the sensitivity of ultrasound for
detection of focal liver lesions is diminished significantly in this
circumstance.

Study otherwise unremarkable.

## 2020-12-06 DIAGNOSIS — F418 Other specified anxiety disorders: Secondary | ICD-10-CM | POA: Diagnosis not present

## 2020-12-06 DIAGNOSIS — I10 Essential (primary) hypertension: Secondary | ICD-10-CM | POA: Diagnosis not present

## 2020-12-06 DIAGNOSIS — K7581 Nonalcoholic steatohepatitis (NASH): Secondary | ICD-10-CM | POA: Diagnosis not present

## 2020-12-06 DIAGNOSIS — Z0000 Encounter for general adult medical examination without abnormal findings: Secondary | ICD-10-CM | POA: Diagnosis not present

## 2020-12-06 DIAGNOSIS — G43909 Migraine, unspecified, not intractable, without status migrainosus: Secondary | ICD-10-CM | POA: Diagnosis not present

## 2020-12-06 DIAGNOSIS — N926 Irregular menstruation, unspecified: Secondary | ICD-10-CM | POA: Diagnosis not present

## 2021-01-24 DIAGNOSIS — M2550 Pain in unspecified joint: Secondary | ICD-10-CM | POA: Diagnosis not present

## 2021-01-24 DIAGNOSIS — I10 Essential (primary) hypertension: Secondary | ICD-10-CM | POA: Diagnosis not present

## 2021-01-24 DIAGNOSIS — F418 Other specified anxiety disorders: Secondary | ICD-10-CM | POA: Diagnosis not present

## 2021-01-24 DIAGNOSIS — E875 Hyperkalemia: Secondary | ICD-10-CM | POA: Diagnosis not present

## 2021-01-24 DIAGNOSIS — M797 Fibromyalgia: Secondary | ICD-10-CM | POA: Diagnosis not present

## 2021-01-24 DIAGNOSIS — N926 Irregular menstruation, unspecified: Secondary | ICD-10-CM | POA: Diagnosis not present

## 2023-08-28 ENCOUNTER — Telehealth: Payer: Self-pay

## 2023-08-28 NOTE — Telephone Encounter (Signed)
 Called patient to schedule new patient appointment. Left voicemail with our contact information to call back and schedule.

## 2023-10-13 ENCOUNTER — Ambulatory Visit: Admitting: Obstetrics and Gynecology

## 2023-10-13 ENCOUNTER — Encounter: Payer: Self-pay | Admitting: Obstetrics and Gynecology

## 2023-10-13 ENCOUNTER — Other Ambulatory Visit (HOSPITAL_COMMUNITY)
Admission: RE | Admit: 2023-10-13 | Discharge: 2023-10-13 | Disposition: A | Discharge status: Home / Self Care | Source: Ambulatory Visit | Attending: Obstetrics and Gynecology | Admitting: Obstetrics and Gynecology

## 2023-10-13 VITALS — BP 119/79 | HR 71 | Ht 62.4 in | Wt 220.1 lb

## 2023-10-13 DIAGNOSIS — Z124 Encounter for screening for malignant neoplasm of cervix: Secondary | ICD-10-CM | POA: Diagnosis not present

## 2023-10-13 DIAGNOSIS — Z30011 Encounter for initial prescription of contraceptive pills: Secondary | ICD-10-CM

## 2023-10-13 DIAGNOSIS — Z01419 Encounter for gynecological examination (general) (routine) without abnormal findings: Secondary | ICD-10-CM | POA: Diagnosis not present

## 2023-10-13 DIAGNOSIS — Z113 Encounter for screening for infections with a predominantly sexual mode of transmission: Secondary | ICD-10-CM | POA: Insufficient documentation

## 2023-10-13 DIAGNOSIS — N926 Irregular menstruation, unspecified: Secondary | ICD-10-CM

## 2023-10-13 DIAGNOSIS — N946 Dysmenorrhea, unspecified: Secondary | ICD-10-CM | POA: Diagnosis not present

## 2023-10-13 MED ORDER — NORETHINDRONE 0.35 MG PO TABS: 1.0000 | ORAL_TABLET | Freq: Every day | ORAL | 11 refills | Status: AC

## 2023-10-13 MED ORDER — IBUPROFEN 600 MG PO TABS: 600.0000 mg | ORAL_TABLET | Freq: Four times a day (QID) | ORAL | 3 refills | Status: AC | PRN

## 2023-10-13 NOTE — Progress Notes (Unsigned)
 ANNUAL EXAM Patient name: Stephanie Le MRN [id number]  Date of birth: January 09, 1986 Chief Complaint:   Gynecologic Exam (New pt GYN)  History of Present Illness:   Stephanie Le is a 38 y.o. G0P0000  female being seen today for a routine annual exam.  Current complaints: interested in birth control, was on pills previously, concerned about weight gain  Periods have always been irregular. First cycle in 8th grade, then did not have cycle in 4 years. Will skip months at a time. Does experience hair growth, hard time lose weight gain. Just had A1c checked recently and results were prediabetic She is sexually active with one partner Is established with primary care, recent visit  Hx of migraines and hypertension  Patient's last menstrual period was 09/20/2023 (exact date).   The pregnancy intention screening data noted above was reviewed. Potential methods of contraception were discussed. The patient elected to proceed with OCP  Gynecologic History Patient's last menstrual period was .6/8 Contraception: condoms  Last Pap: more than 3 years ago. Results were: Normal Last mammogram:n/a       No data to display               No data to display           Review of Systems:   Pertinent items are noted in HPI Denies any headaches, blurred vision, fatigue, shortness of breath, chest pain, abdominal pain, abnormal vaginal discharge/itching/odor/irritation, problems with periods, bowel movements, urination, or intercourse unless otherwise stated above. Pertinent History Reviewed:  Reviewed past medical,surgical, social and family history.  Reviewed problem list, medications and allergies. Physical Assessment:   Vitals:   10/13/23 1425 10/13/23 1436  BP: (!) 126/95 119/79  Pulse: 80 71  Weight: 220 lb 1.9 oz (99.8 kg)   Height: 5' 2.4 (1.585 m)   Body mass index is 39.75 kg/m.        Physical Examination:   General appearance - well appearing, and  in no distress  Mental status - alert, oriented   Psych:  She has a normal mood and affect  Skin - warm and dry, normal color  Chest - effort normal, all lung fields clear to auscultation bilaterally  Heart - normal rate and regular rhythm  Neck:  midline trachea  Breasts - breasts appear normal, no suspicious masses, no skin or nipple changes or  axillary nodes  Abdomen - soft, nontender  Pelvic - VULVA: normal appearing vulva with no masses, tenderness or lesions  VAGINA: normal appearing vagina with normal color and discharge, no lesions  CERVIX: normal appearing cervix without discharge or lesions, no CMT  Thin prep pap is done w HR HPV cotesting  UTERUS: uterus is felt to be normal size, shape, consistency and nontender   ADNEXA: No adnexal masses or tenderness noted.  Extremities:  No swelling or varicosities noted  Chaperone present for exam  No results found for this or any previous visit (from the past 24 hours).  Assessment & Plan:  1. Encounter for annual routine gynecological examination (Primary) Pap today Encouraged routine self breast exams Just saw PCP  2. Cervical cancer screening  - Cytology - PAP( [facility])  3. Screen for STD (sexually transmitted disease)  - Cytology - PAP( [facility]) - HIV antibody (with reflex) - RPR - Hepatitis B Surface AntiGEN - Hepatitis C Antibody  4. Encounter for initial prescription of contraceptive pills Discussed initiation, side effects, potential for breakthrough bleeding  -norethindrone  - POCT  urine pregnancy  5. Irregular periods 6. Dysmenorrhea Likely PCOS given hx irregular cycles, hirsutism, she is newly prediabetic. Discussed the management of PCOS involves amelioration of hyperandrogenic symptoms, metabolic abnormalities. Discussed options for birth control, she does not desire IUD, or implant. Defer depo d/t risk weight gain. Given hx of HTN and migraine will trial POP.  Discussed dosed ibuprofen for  dysmenorrhea Discussed use of metformin, would like to defer today Will continue dietary changes and exercise changes, declines dietician currently    Labs/procedures today:   Mammogram: @ 36yo, or sooner if problems Colonoscopy: @ 36yo, or sooner if problems  Orders Placed This Encounter  Procedures   HIV antibody (with reflex)   RPR   Hepatitis B Surface AntiGEN   Hepatitis C Antibody   POCT urine pregnancy    Meds:  Meds ordered this encounter  Medications   norethindrone (MICRONOR) 0.35 MG tablet    Sig: Take 1 tablet (0.35 mg total) by mouth daily.    Dispense:  28 tablet    Refill:  11   ibuprofen (ADVIL) 600 MG tablet    Sig: Take 1 tablet (600 mg total) by mouth every 6 (six) hours as needed.    Dispense:  60 tablet    Refill:  3    Follow-up: Return in about 3 months (around 01/13/2024), or birth control follow up.   Nidia Daring, FNP

## 2023-10-14 ENCOUNTER — Ambulatory Visit: Payer: Self-pay | Admitting: Obstetrics and Gynecology

## 2023-10-14 LAB — HIV ANTIBODY (ROUTINE TESTING W REFLEX): HIV Screen 4th Generation wRfx: NONREACTIVE

## 2023-10-14 LAB — HEPATITIS C ANTIBODY: Hep C Virus Ab: NONREACTIVE

## 2023-10-14 LAB — RPR: RPR Ser Ql: NONREACTIVE

## 2023-10-14 LAB — POCT URINE PREGNANCY: Preg Test, Ur: NEGATIVE

## 2023-10-14 LAB — HEPATITIS B SURFACE ANTIGEN: Hepatitis B Surface Ag: NEGATIVE

## 2023-10-14 NOTE — Telephone Encounter (Signed)
-----   Message from [facility] sent at 10/14/2023  8:31 AM EDT ----- Not set up in [facility], Can we notify of normal and negative blood work, thanks! ----- Message ----- From: Rebecka, [facility] Lab Results In Sent: 10/14/2023   7:37 AM EDT To: Nidia LITTIE Daring, FNP

## 2023-10-14 NOTE — Telephone Encounter (Signed)
 Patient notified of test results.  Stephanie Le

## 2023-10-19 LAB — CYTOLOGY - PAP
Chlamydia: NEGATIVE
Comment: NEGATIVE
Comment: NEGATIVE
Comment: NEGATIVE
Comment: NORMAL
Diagnosis: NEGATIVE
High risk HPV: NEGATIVE
Neisseria Gonorrhea: NEGATIVE
Trichomonas: NEGATIVE
